# Patient Record
Sex: Male | Born: 1955 | Race: White | Hispanic: No | Marital: Married | Smoking: Never smoker
Health system: Southern US, Community
[De-identification: ages and names within clinical notes are randomized; demographics above are authoritative.]

## PROBLEM LIST (undated history)

## (undated) DIAGNOSIS — T7840XA Allergy, unspecified, initial encounter: Secondary | ICD-10-CM

## (undated) DIAGNOSIS — S83519A Sprain of anterior cruciate ligament of unspecified knee, initial encounter: Secondary | ICD-10-CM

## (undated) DIAGNOSIS — Z860100 Personal history of colon polyps, unspecified: Secondary | ICD-10-CM

## (undated) DIAGNOSIS — R7303 Prediabetes: Secondary | ICD-10-CM

## (undated) DIAGNOSIS — T1490XA Injury, unspecified, initial encounter: Secondary | ICD-10-CM

## (undated) DIAGNOSIS — E785 Hyperlipidemia, unspecified: Secondary | ICD-10-CM

## (undated) DIAGNOSIS — I10 Essential (primary) hypertension: Secondary | ICD-10-CM

## (undated) HISTORY — DX: Prediabetes: R73.03

## (undated) HISTORY — DX: Personal history of colonic polyps: Z86.010

## (undated) HISTORY — PX: UPPER GI ENDOSCOPY: SHX6162

## (undated) HISTORY — DX: Essential (primary) hypertension: I10

## (undated) HISTORY — DX: Personal history of colon polyps, unspecified: Z86.0100

## (undated) HISTORY — PX: HERNIA REPAIR: SHX51

## (undated) HISTORY — DX: Allergy, unspecified, initial encounter: T78.40XA

## (undated) HISTORY — DX: Sprain of anterior cruciate ligament of unspecified knee, initial encounter: S83.519A

---

## 1999-08-02 ENCOUNTER — Encounter: Payer: Self-pay | Admitting: Endocrinology

## 1999-08-02 ENCOUNTER — Encounter: Admission: RE | Admit: 1999-08-02 | Discharge: 1999-08-02 | Payer: Self-pay | Admitting: Endocrinology

## 1999-10-25 ENCOUNTER — Encounter: Payer: Self-pay | Admitting: Neurology

## 1999-10-25 ENCOUNTER — Encounter: Admission: RE | Admit: 1999-10-25 | Discharge: 1999-10-25 | Payer: Self-pay | Admitting: Neurology

## 2000-06-19 ENCOUNTER — Encounter: Payer: Self-pay | Admitting: Endocrinology

## 2000-06-19 ENCOUNTER — Encounter: Admission: RE | Admit: 2000-06-19 | Discharge: 2000-06-19 | Payer: Self-pay | Admitting: Endocrinology

## 2004-11-11 ENCOUNTER — Ambulatory Visit (HOSPITAL_COMMUNITY): Admission: RE | Admit: 2004-11-11 | Discharge: 2004-11-11 | Payer: Self-pay | Admitting: *Deleted

## 2008-05-29 DIAGNOSIS — S83519A Sprain of anterior cruciate ligament of unspecified knee, initial encounter: Secondary | ICD-10-CM

## 2008-05-29 HISTORY — DX: Sprain of anterior cruciate ligament of unspecified knee, initial encounter: S83.519A

## 2008-05-29 HISTORY — PX: ANTERIOR CRUCIATE LIGAMENT REPAIR: SHX115

## 2013-03-26 ENCOUNTER — Emergency Department (HOSPITAL_COMMUNITY): Payer: No Typology Code available for payment source

## 2013-03-26 ENCOUNTER — Emergency Department (HOSPITAL_COMMUNITY)
Admission: EM | Admit: 2013-03-26 | Discharge: 2013-03-27 | Disposition: A | Discharge status: Home / Self Care | Payer: No Typology Code available for payment source | Attending: Emergency Medicine | Admitting: Emergency Medicine

## 2013-03-26 ENCOUNTER — Encounter (HOSPITAL_COMMUNITY): Payer: Self-pay | Admitting: Emergency Medicine

## 2013-03-26 DIAGNOSIS — R51 Headache: Secondary | ICD-10-CM | POA: Insufficient documentation

## 2013-03-26 DIAGNOSIS — Z7982 Long term (current) use of aspirin: Secondary | ICD-10-CM | POA: Insufficient documentation

## 2013-03-26 DIAGNOSIS — S20219A Contusion of unspecified front wall of thorax, initial encounter: Secondary | ICD-10-CM

## 2013-03-26 DIAGNOSIS — Y92410 Unspecified street and highway as the place of occurrence of the external cause: Secondary | ICD-10-CM | POA: Insufficient documentation

## 2013-03-26 DIAGNOSIS — E785 Hyperlipidemia, unspecified: Secondary | ICD-10-CM | POA: Insufficient documentation

## 2013-03-26 DIAGNOSIS — Y9389 Activity, other specified: Secondary | ICD-10-CM | POA: Insufficient documentation

## 2013-03-26 DIAGNOSIS — Z79899 Other long term (current) drug therapy: Secondary | ICD-10-CM | POA: Insufficient documentation

## 2013-03-26 HISTORY — DX: Hyperlipidemia, unspecified: E78.5
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
## 2013-03-26 NOTE — ED Notes (Signed)
Pt to CT at this time.

## 2013-03-27 ENCOUNTER — Encounter (HOSPITAL_COMMUNITY): Payer: Self-pay | Admitting: Radiology

## 2013-03-27 ENCOUNTER — Emergency Department (HOSPITAL_COMMUNITY): Payer: No Typology Code available for payment source

## 2013-03-27 MED ORDER — OXYCODONE-ACETAMINOPHEN 5-325 MG PO TABS: 1.0000 | ORAL_TABLET | ORAL | Status: DC | PRN
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
## 2013-03-27 NOTE — ED Notes (Signed)
C-collar remains on pt.  Pt sitting up on stretcher.  Wife at bedside.  No complaints voiced at this time.

## 2014-08-13 ENCOUNTER — Other Ambulatory Visit: Payer: Self-pay | Admitting: General Surgery

## 2014-08-13 NOTE — Progress Notes (Signed)
Please put orders in Epic surgery 08-25-14 pre op 08-18-14 Thanks

## 2014-08-18 ENCOUNTER — Encounter (HOSPITAL_COMMUNITY): Payer: Self-pay

## 2014-08-18 ENCOUNTER — Encounter (HOSPITAL_COMMUNITY)
Admission: RE | Admit: 2014-08-18 | Discharge: 2014-08-18 | Disposition: A | Discharge status: Home / Self Care | Payer: 59 | Source: Ambulatory Visit | Attending: General Surgery | Admitting: General Surgery

## 2014-08-18 DIAGNOSIS — Z01812 Encounter for preprocedural laboratory examination: Secondary | ICD-10-CM | POA: Diagnosis not present

## 2014-08-18 HISTORY — DX: Injury, unspecified, initial encounter: T14.90XA

## 2014-08-18 LAB — BASIC METABOLIC PANEL
Anion gap: 8 (ref 5–15)
BUN: 14 mg/dL (ref 6–23)
CO2: 28 mmol/L (ref 19–32)
CREATININE: 1.2 mg/dL (ref 0.50–1.35)
Calcium: 10 mg/dL (ref 8.4–10.5)
Chloride: 104 mmol/L (ref 96–112)
GFR calc Af Amer: 76 mL/min — ABNORMAL LOW (ref >90)
GFR calc non Af Amer: 65 mL/min — ABNORMAL LOW (ref >90)
Glucose, Bld: 99 mg/dL (ref 70–99)
Potassium: 5.6 mmol/L — ABNORMAL HIGH (ref 3.5–5.1)
SODIUM: 140 mmol/L (ref 135–145)

## 2014-08-18 LAB — CBC
HCT: 44 % (ref 39.0–52.0)
HEMOGLOBIN: 14.5 g/dL (ref 13.0–17.0)
MCH: 29 pg (ref 26.0–34.0)
MCHC: 33 g/dL (ref 30.0–36.0)
MCV: 88 fL (ref 78.0–100.0)
Platelets: 180 10*3/uL (ref 150–400)
RBC: 5 MIL/uL (ref 4.22–5.81)
RDW: 13.1 % (ref 11.5–15.5)
WBC: 4.1 10*3/uL (ref 4.0–10.5)

## 2014-08-18 NOTE — Patient Instructions (Signed)
20 Mindi Curlingllen R Twilley  08/18/2014   Your procedure is scheduled on:       Tuesday August 25, 2014   [facility] and follow signs to  Short Stay Center arrive at 0530 AM.   Call this number if you have problems the morning of surgery [phone] or Presurgical Testing [phone].   Remember:  Do not eat food or drink liquids :After Midnight.      Take these medicines the morning of surgery with A SIP OF WATER: NONE                                You may not have any metal on your body including hair pins and piercings  Do not wear jewelry, colognes, lotions, powders, or deodorant.  Men may shave face and neck.               Do not bring valuables to the hospital. [facility] IS NOT RESPONSIBLE FOR VALUABLES.  Contacts, dentures or bridgework may not be worn into surgery.   Patients discharged the day of surgery will not be allowed to drive home.  Name and phone number of your driver:Sandy Stroder (wife) ________________________________________________________________________  [facility] - Preparing for Surgery Before surgery, you can play an important role.  Because skin is not sterile, your skin needs to be as free of germs as possible.  You can reduce the number of germs on your skin by washing with CHG (chlorahexidine gluconate) soap before surgery.  CHG is an antiseptic cleaner which kills germs and bonds with the skin to continue killing germs even after washing. Please DO NOT use if you have an allergy to CHG or antibacterial soaps.  If your skin becomes reddened/irritated stop using the CHG and inform your nurse when you arrive at Short Stay. Do not shave (including legs and underarms) for at least 48 hours prior to the first CHG shower.  You may shave your face/neck. Please follow these instructions carefully:  1.  Shower with CHG Soap the night before surgery and the  morning of Surgery.  2.  If you choose to wash your hair, wash your hair first as  usual with your  normal  shampoo.  3.  After you shampoo, rinse your hair and body thoroughly to remove the  shampoo.                           4.  Use CHG as you would any other liquid soap.  You can apply chg directly  to the skin and wash                       Gently with a scrungie or clean washcloth.  5.  Apply the CHG Soap to your body ONLY FROM THE NECK DOWN.   Do not use on face/ open                           Wound or open sores. Avoid contact with eyes, ears mouth and genitals (private parts).                       Wash face,  Genitals (private parts) with your normal soap.             6.  Wash thoroughly, paying special attention to the area where your surgery  will be performed.  7.  Thoroughly rinse your body with warm water from the neck down.  8.  DO NOT shower/wash with your normal soap after using and rinsing off  the CHG Soap.                9.  Pat yourself dry with a clean towel.            10.  Wear clean pajamas.            11.  Place clean sheets on your bed the night of your first shower and do not  sleep with pets. Day of Surgery : Do not apply any lotions/deodorants the morning of surgery.  Please wear clean clothes to the hospital/surgery center.  FAILURE TO FOLLOW THESE INSTRUCTIONS MAY RESULT IN THE CANCELLATION OF YOUR SURGERY PATIENT SIGNATURE_________________________________  NURSE SIGNATURE__________________________________  ________________________________________________________________________

## 2014-08-18 NOTE — Progress Notes (Signed)
BMP results per epic per PAT visit 08/18/2014 sent to Dr Johna SheriffHoxworth

## 2014-08-25 ENCOUNTER — Ambulatory Visit (HOSPITAL_COMMUNITY): Payer: 59 | Admitting: Anesthesiology

## 2014-08-25 ENCOUNTER — Ambulatory Visit (HOSPITAL_COMMUNITY)
Admission: RE | Admit: 2014-08-25 | Discharge: 2014-08-25 | Disposition: A | Discharge status: Home / Self Care | Payer: 59 | Source: Ambulatory Visit | Attending: General Surgery | Admitting: General Surgery

## 2014-08-25 ENCOUNTER — Encounter (HOSPITAL_COMMUNITY): Payer: Self-pay | Admitting: *Deleted

## 2014-08-25 ENCOUNTER — Encounter (HOSPITAL_COMMUNITY)
Admission: RE | Disposition: A | Discharge status: Home / Self Care | Payer: Self-pay | Source: Ambulatory Visit | Attending: General Surgery

## 2014-08-25 DIAGNOSIS — K4090 Unilateral inguinal hernia, without obstruction or gangrene, not specified as recurrent: Secondary | ICD-10-CM | POA: Diagnosis present

## 2014-08-25 DIAGNOSIS — Z7982 Long term (current) use of aspirin: Secondary | ICD-10-CM | POA: Diagnosis not present

## 2014-08-25 DIAGNOSIS — Z6830 Body mass index (BMI) 30.0-30.9, adult: Secondary | ICD-10-CM | POA: Diagnosis not present

## 2014-08-25 DIAGNOSIS — Z79891 Long term (current) use of opiate analgesic: Secondary | ICD-10-CM | POA: Diagnosis not present

## 2014-08-25 HISTORY — PX: INGUINAL HERNIA REPAIR: SHX194

## 2014-08-25 SURGERY — REPAIR, HERNIA, INGUINAL, LAPAROSCOPIC
Anesthesia: General | Laterality: Right

## 2014-08-25 MED ORDER — CHLORHEXIDINE GLUCONATE 4 % EX LIQD: 1.0000 "application " | Freq: Once | CUTANEOUS | Status: DC

## 2014-08-25 MED ORDER — MIDAZOLAM HCL 2 MG/2ML IJ SOLN
INTRAMUSCULAR | Status: AC
  Filled 2014-08-25: qty 2

## 2014-08-25 MED ORDER — PROMETHAZINE HCL 25 MG/ML IJ SOLN
6.2500 mg | INTRAMUSCULAR | Status: DC | PRN
  Administered 2014-08-25: 6.25 mg via INTRAVENOUS
  Filled 2014-08-25: qty 1

## 2014-08-25 MED ORDER — NEOSTIGMINE METHYLSULFATE 10 MG/10ML IV SOLN
INTRAVENOUS | Status: DC | PRN
  Administered 2014-08-25: 5 mg via INTRAVENOUS

## 2014-08-25 MED ORDER — ONDANSETRON HCL 4 MG/2ML IJ SOLN
INTRAMUSCULAR | Status: DC | PRN
  Administered 2014-08-25: 4 mg via INTRAVENOUS

## 2014-08-25 MED ORDER — LACTATED RINGERS IV SOLN
INTRAVENOUS | Status: DC | PRN
  Administered 2014-08-25 (×2): via INTRAVENOUS

## 2014-08-25 MED ORDER — FENTANYL CITRATE 0.05 MG/ML IJ SOLN
INTRAMUSCULAR | Status: AC
  Filled 2014-08-25: qty 5

## 2014-08-25 MED ORDER — OXYCODONE-ACETAMINOPHEN 5-325 MG PO TABS
1.0000 | ORAL_TABLET | ORAL | Status: DC | PRN
  Administered 2014-08-25: 1 via ORAL
  Filled 2014-08-25: qty 1

## 2014-08-25 MED ORDER — LIDOCAINE HCL (CARDIAC) 20 MG/ML IV SOLN
INTRAVENOUS | Status: DC | PRN
  Administered 2014-08-25: 100 mg via INTRAVENOUS

## 2014-08-25 MED ORDER — GLYCOPYRROLATE 0.2 MG/ML IJ SOLN
INTRAMUSCULAR | Status: AC
  Filled 2014-08-25: qty 3

## 2014-08-25 MED ORDER — BUPIVACAINE-EPINEPHRINE 0.25% -1:200000 IJ SOLN
INTRAMUSCULAR | Status: DC | PRN
  Administered 2014-08-25: 30 mL

## 2014-08-25 MED ORDER — ONDANSETRON HCL 4 MG/2ML IJ SOLN
INTRAMUSCULAR | Status: AC
  Filled 2014-08-25: qty 2

## 2014-08-25 MED ORDER — BUPIVACAINE-EPINEPHRINE (PF) 0.25% -1:200000 IJ SOLN
INTRAMUSCULAR | Status: AC
  Filled 2014-08-25: qty 30

## 2014-08-25 MED ORDER — PROPOFOL 10 MG/ML IV BOLUS
INTRAVENOUS | Status: AC
  Filled 2014-08-25: qty 20

## 2014-08-25 MED ORDER — CEFAZOLIN SODIUM-DEXTROSE 2-3 GM-% IV SOLR
2.0000 g | INTRAVENOUS | Status: AC
  Administered 2014-08-25: 2 g via INTRAVENOUS

## 2014-08-25 MED ORDER — PROPOFOL 10 MG/ML IV BOLUS
INTRAVENOUS | Status: DC | PRN
  Administered 2014-08-25: 200 mg via INTRAVENOUS

## 2014-08-25 MED ORDER — MEPERIDINE HCL 50 MG/ML IJ SOLN: 6.2500 mg | INTRAMUSCULAR | Status: DC | PRN

## 2014-08-25 MED ORDER — HYDROMORPHONE HCL 1 MG/ML IJ SOLN
0.2500 mg | INTRAMUSCULAR | Status: DC | PRN
  Administered 2014-08-25 (×2): 0.5 mg via INTRAVENOUS

## 2014-08-25 MED ORDER — ROCURONIUM BROMIDE 100 MG/10ML IV SOLN
INTRAVENOUS | Status: AC
  Filled 2014-08-25: qty 1

## 2014-08-25 MED ORDER — MIDAZOLAM HCL 2 MG/2ML IJ SOLN: 0.5000 mg | Freq: Once | INTRAMUSCULAR | Status: DC | PRN

## 2014-08-25 MED ORDER — GLYCOPYRROLATE 0.2 MG/ML IJ SOLN
INTRAMUSCULAR | Status: DC | PRN
  Administered 2014-08-25: .6 mg via INTRAVENOUS

## 2014-08-25 MED ORDER — MIDAZOLAM HCL 5 MG/5ML IJ SOLN
INTRAMUSCULAR | Status: DC | PRN
  Administered 2014-08-25: 2 mg via INTRAVENOUS

## 2014-08-25 MED ORDER — FENTANYL CITRATE 0.05 MG/ML IJ SOLN
INTRAMUSCULAR | Status: DC | PRN
  Administered 2014-08-25 (×5): 50 ug via INTRAVENOUS

## 2014-08-25 MED ORDER — CEFAZOLIN SODIUM-DEXTROSE 2-3 GM-% IV SOLR
INTRAVENOUS | Status: AC
  Filled 2014-08-25: qty 50

## 2014-08-25 MED ORDER — NEOSTIGMINE METHYLSULFATE 10 MG/10ML IV SOLN
INTRAVENOUS | Status: AC
  Filled 2014-08-25: qty 1

## 2014-08-25 MED ORDER — ROCURONIUM BROMIDE 100 MG/10ML IV SOLN
INTRAVENOUS | Status: DC | PRN
  Administered 2014-08-25: 45 mg via INTRAVENOUS
  Administered 2014-08-25: 20 mg via INTRAVENOUS
  Administered 2014-08-25: 5 mg via INTRAVENOUS

## 2014-08-25 MED ORDER — 0.9 % SODIUM CHLORIDE (POUR BTL) OPTIME
TOPICAL | Status: DC | PRN
  Administered 2014-08-25: 1000 mL

## 2014-08-25 MED ORDER — LIDOCAINE HCL (CARDIAC) 20 MG/ML IV SOLN
INTRAVENOUS | Status: AC
  Filled 2014-08-25: qty 5

## 2014-08-25 MED ORDER — HYDROMORPHONE HCL 1 MG/ML IJ SOLN
INTRAMUSCULAR | Status: AC
  Filled 2014-08-25: qty 1

## 2014-08-25 MED ORDER — OXYCODONE-ACETAMINOPHEN 5-325 MG PO TABS: 1.0000 | ORAL_TABLET | ORAL | Status: DC | PRN

## 2014-08-25 SURGICAL SUPPLY — 38 items
APPLIER CLIP 5 13 M/L LIGAMAX5 (MISCELLANEOUS)
BENZOIN TINCTURE PRP APPL 2/3 (GAUZE/BANDAGES/DRESSINGS) ×2 IMPLANT
CLIP APPLIE 5 13 M/L LIGAMAX5 (MISCELLANEOUS) IMPLANT
DECANTER SPIKE VIAL GLASS SM (MISCELLANEOUS) IMPLANT
DEVICE SECURE STRAP 25 ABSORB (INSTRUMENTS) ×2 IMPLANT
DISSECT BALLN SPACEMKR + OVL (BALLOONS) ×2
DISSECTOR BALLN SPACEMKR + OVL (BALLOONS) ×1 IMPLANT
DISSECTOR BLUNT TIP ENDO 5MM (MISCELLANEOUS) IMPLANT
DRAPE INCISE IOBAN 66X45 STRL (DRAPES) ×2 IMPLANT
DRAPE LAPAROSCOPIC ABDOMINAL (DRAPES) ×2 IMPLANT
ELECT REM PT RETURN 9FT ADLT (ELECTROSURGICAL) ×2
ELECTRODE REM PT RTRN 9FT ADLT (ELECTROSURGICAL) ×1 IMPLANT
GLOVE BIOGEL PI IND STRL 7.0 (GLOVE) ×1 IMPLANT
GLOVE BIOGEL PI INDICATOR 7.0 (GLOVE) ×1
GOWN STRL REUS W/TWL LRG LVL3 (GOWN DISPOSABLE) ×2 IMPLANT
GOWN STRL REUS W/TWL XL LVL3 (GOWN DISPOSABLE) ×4 IMPLANT
KIT BASIN OR (CUSTOM PROCEDURE TRAY) ×2 IMPLANT
LIQUID BAND (GAUZE/BANDAGES/DRESSINGS) IMPLANT
MARKER SKIN DUAL TIP RULER LAB (MISCELLANEOUS) ×2 IMPLANT
MESH 3DMAX 5X7 RT XLRG (Mesh General) ×2 IMPLANT
NEEDLE INSUFFLATION 14GA 120MM (NEEDLE) IMPLANT
SCISSORS LAP 5X35 DISP (ENDOMECHANICALS) ×2 IMPLANT
SET IRRIG TUBING LAPAROSCOPIC (IRRIGATION / IRRIGATOR) IMPLANT
SOLUTION ANTI FOG 6CC (MISCELLANEOUS) ×2 IMPLANT
STRIP CLOSURE SKIN 1/2X4 (GAUZE/BANDAGES/DRESSINGS) ×2 IMPLANT
SUT MNCRL AB 4-0 PS2 18 (SUTURE) ×4 IMPLANT
SUT PROLENE 2 0 CT2 30 (SUTURE) IMPLANT
SUT SILK 0 (SUTURE) ×1
SUT SILK 0 30XBRD TIE 6 (SUTURE) ×1 IMPLANT
SUT VIC AB 3-0 SH 27 (SUTURE)
SUT VIC AB 3-0 SH 27XBRD (SUTURE) IMPLANT
TACKER 5MM HERNIA 3.5CML NAB (ENDOMECHANICALS) ×2 IMPLANT
TOWEL OR 17X26 10 PK STRL BLUE (TOWEL DISPOSABLE) ×2 IMPLANT
TRAY FOLEY CATH 14FRSI W/METER (CATHETERS) IMPLANT
TRAY FOLEY METER SIL LF 16FR (CATHETERS) ×2 IMPLANT
TRAY LAPAROSCOPIC (CUSTOM PROCEDURE TRAY) ×2 IMPLANT
TROCAR CANNULA W/PORT DUAL 5MM (MISCELLANEOUS) ×4 IMPLANT
TUBING INSUFFLATION 10FT LAP (TUBING) ×2 IMPLANT

## 2014-08-25 NOTE — Anesthesia Preprocedure Evaluation (Addendum)
Anesthesia Evaluation  Patient identified by MRN, date of birth, ID band Patient awake    Reviewed: Allergy & Precautions, NPO status , Patient's Chart, lab work & pertinent test results, reviewed documented beta blocker date and time   Airway Mallampati: II  TM Distance: >3 FB Neck ROM: Full    Dental  (+) Teeth Intact, Dental Advisory Given   Pulmonary neg pulmonary ROS,  breath sounds clear to auscultation        Cardiovascular - anginanegative cardio ROS  Rhythm:Regular Rate:Normal     Neuro/Psych negative neurological ROS     GI/Hepatic negative GI ROS, Neg liver ROS,   Endo/Other  Morbid obesity  Renal/GU negative Renal ROS     Musculoskeletal   Abdominal (+) + obese,   Peds  Hematology negative hematology ROS (+)   Anesthesia Other Findings   Reproductive/Obstetrics                            Anesthesia Physical Anesthesia Plan  ASA: II  Anesthesia Plan: General   Post-op Pain Management:    Induction: Intravenous  Airway Management Planned: Oral ETT  Additional Equipment:   Intra-op Plan:   Post-operative Plan: Extubation in OR  Informed Consent: I have reviewed the patients History and Physical, chart, labs and discussed the procedure including the risks, benefits and alternatives for the proposed anesthesia with the patient or authorized representative who has indicated his/her understanding and acceptance.   Dental advisory given  Plan Discussed with: CRNA and Surgeon  Anesthesia Plan Comments: (Plan routine monitors, GETA)        Anesthesia Quick Evaluation

## 2014-08-25 NOTE — Interval H&P Note (Signed)
History and Physical Interval Note:  08/25/2014 7:32 AM  Phillip [name]  has presented today for surgery, with the diagnosis of right inguinal hernia  The various methods of treatment have been discussed with the patient and family. After consideration of risks, benefits and other options for treatment, the patient has consented to  Procedure(s): LAPAROSCOPIC RIGHT INGUINAL HERNIA REPAIR (Right) as a surgical intervention .  The patient's history has been reviewed, patient examined, no change in status, stable for surgery.  I have reviewed the patient's chart and labs.  Questions were answered to the patient's satisfaction.     [name] T

## 2014-08-25 NOTE — Discharge Instructions (Signed)
[facility] _______[facility]  UMBILICAL OR INGUINAL HERNIA REPAIR: POST OP INSTRUCTIONS  Always review your discharge instruction sheet given to you by the facility where your surgery was performed. IF YOU HAVE DISABILITY OR FAMILY LEAVE FORMS, YOU MUST BRING THEM TO THE OFFICE FOR PROCESSING.   DO NOT GIVE THEM TO YOUR DOCTOR.  1. A  prescription for pain medication may be given to you upon discharge.  Take your pain medication as prescribed, if needed.  If narcotic pain medicine is not needed, then you may take acetaminophen (Tylenol) or ibuprofen (Advil) as needed. 2. Take your usually prescribed medications unless otherwise directed. 3. If you need a refill on your pain medication, please contact your pharmacy.  They will contact our office to request authorization. Prescriptions will not be filled after 5 pm or on week-ends. 4. You should follow a light diet the first 24 hours after arrival home, such as soup and crackers, etc.  Be sure to include lots of fluids daily.  Resume your normal diet the day after surgery. 5. Most patients will experience some swelling and bruising around the umbilicus or in the groin and scrotum.  Ice packs and reclining will help.  Swelling and bruising can take several days to resolve.  6. It is common to experience some constipation if taking pain medication after surgery.  Increasing fluid intake and taking a stool softener (such as Colace) will usually help or prevent this problem from occurring.  A mild laxative (Milk of Magnesia or Miralax) should be taken according to package directions if there are no bowel movements after 48 hours. 7. Unless discharge instructions indicate otherwise, you may remove your bandages 24-48 hours after surgery, and you may shower at that time.  You may have steri-strips (small skin tapes) in place directly over the incision.  These strips should be left on the skin for 7-10 days.  If your surgeon used skin glue on the  incision, you may shower in 24 hours.  The glue will flake off over the next 2-3 weeks.  Any sutures or staples will be removed at the office during your follow-up visit. 8. ACTIVITIES:  You may resume regular (light) daily activities beginning the next day--such as daily self-care, walking, climbing stairs--gradually increasing activities as tolerated.  You may have sexual intercourse when it is comfortable.  Refrain from any heavy lifting or straining until approved by your doctor. a. You may drive when you are no longer taking prescription pain medication, you can comfortably wear a seatbelt, and you can safely maneuver your car and apply brakes. b. RETURN TO WORK:  __________________________________________________________ 9. You should see your doctor in the office for a follow-up appointment approximately 2-3 weeks after your surgery.  Make sure that you call for this appointment within a day or two after you arrive home to insure a convenient appointment time. 10. OTHER INSTRUCTIONS:  __________________________________________________________________________________________________________________________________________________________________________________________  WHEN TO CALL YOUR DOCTOR: 1. Fever over 101.0 2. Inability to urinate 3. Nausea and/or vomiting 4. Extreme swelling or bruising 5. Continued bleeding from incision. 6. Increased pain, redness, or drainage from the incision  The clinic staff is available to answer your questions during regular business hours.  Please don't hesitate to call and ask to speak to one of the nurses for clinical concerns.  If you have a medical emergency, go to the nearest emergency room or call 911.  A surgeon from [facility] is always on call at the hospital     [address], [address], [place], [place]  [address] ?  [address] [address], [place], [place]   [address] [phone] ? [phone] ? FAX [phone] Web site:  [email or web address]  

## 2014-08-25 NOTE — Progress Notes (Signed)
Report to Sharyn CreamerJudy Cooper RN

## 2014-08-25 NOTE — H&P (Signed)
History of Present Illness Phillip [name](Phillip [name] Nigg MD; 07/23/2014 9:36 AM) Patient words: hernia.  The patient is a 59 year old male who presents with an inguinal hernia. 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 He states that about 3 months ago he began to develop an intermittent burning discomfort in his right groin. He had been doing some heavy lifting several days previously. His symptoms have persisted over the last several months. He notices some swelling in the right groin that is larger at times than others. The discomfort is worse with physical activity and coughing and sneezing. He has no GI or urinary symptoms. No previous history of hernia repairs.   Other Problems Phillip [name](Sonya Bynum, CMA; 07/23/2014 8:58 AM) Hypercholesterolemia  Past Surgical History Phillip [name](Sonya Bynum, CMA; 07/23/2014 8:58 AM) Knee Surgery Left.  Diagnostic Studies History Phillip [name](Sonya Bynum, CMA; 07/23/2014 8:58 AM) Colonoscopy 5-10 years ago  Allergies Phillip [name](Sonya Bynum, CMA; 07/23/2014 8:59 AM) No Known Drug Allergies02/25/2016  Medication History (Sonya Bynum, CMA; 07/23/2014 9:00 AM) Aspirin EC (81MG  Tablet DR, Oral) Active. Percocet (5-325MG  Tablet, Oral as needed) Active. Medications Reconciled  Social History Phillip [name](Sonya Bynum, CMA; 07/23/2014 8:58 AM) Alcohol use Moderate alcohol use. Caffeine use Coffee. No drug use Tobacco use Never smoker.  Family History Phillip [name](Sonya Bynum, CMA; 07/23/2014 8:58 AM) Diabetes Mellitus Mother. Heart Disease Mother. Hypertension Mother.  Review of Systems Phillip [name](Sonya Bynum CMA; 07/23/2014 8:58 AM) General Not Present- Appetite Loss, Chills, Fatigue, Fever, Night Sweats, Weight Gain and Weight Loss. Skin Not Present- Change in Wart/Mole, Dryness, Hives, Jaundice, New Lesions, Non-Healing Wounds, Rash and Ulcer. HEENT Not Present- Earache, Hearing Loss, Hoarseness, Nose Bleed, Oral Ulcers, Ringing in the Ears, Seasonal Allergies, Sinus Pain, Sore  Throat, Visual Disturbances, Wears glasses/contact lenses and Yellow Eyes. Respiratory Not Present- Bloody sputum, Chronic Cough, Difficulty Breathing, Snoring and Wheezing. Breast Not Present- Breast Mass, Breast Pain, Nipple Discharge and Skin Changes. Cardiovascular Not Present- Chest Pain, Difficulty Breathing Lying Down, Leg Cramps, Palpitations, Rapid Heart Rate, Shortness of Breath and Swelling of Extremities. Gastrointestinal Present- Abdominal Pain. Not Present- Bloating, Bloody Stool, Change in Bowel Habits, Chronic diarrhea, Constipation, Difficulty Swallowing, Excessive gas, Gets full quickly at meals, Hemorrhoids, Indigestion, Nausea, Rectal Pain and Vomiting. Male Genitourinary Not Present- Blood in Urine, Change in Urinary Stream, Frequency, Impotence, Nocturia, Painful Urination, Urgency and Urine Leakage.   Vitals (Sonya Bynum CMA; 07/23/2014 8:59 AM) 07/23/2014 8:58 AM Weight: 237 lb Height: 74in Body Surface Area: 2.37 m Body Mass Index: 30.43 kg/m Temp.: 47F(Temporal)  Pulse: 75 (Regular)  BP: 134/82 (Sitting, Left Arm, Standard)    Physical Exam Phillip [name](Shinita Mac T. Amiir Heckard MD; 07/23/2014 9:37 AM) The physical exam findings are as follows: Note:General: Alert, well-developed and well nourished Caucasian male, in no distress Skin: Warm and dry without rash or infection. HEENT: No palpable masses or thyromegaly. Sclera nonicteric. Pupils equal round and reactive. Lymph nodes: No cervical, supraclavicular, or inguinal nodes palpable. Lungs: Breath sounds clear and equal. No wheezing or increased work of breathing. Cardiovascular: Regular rate and rhythm without murmer. No JVD or edema. Abdomen: Nondistended. Soft and nontender. No masses palpable. No organomegaly. There is a moderate sized slightly tender reducible right inguinal hernia that does not extend down toward the scrotum. No hernia palpable on the left. Testicles normal. Extremities: No edema or joint  swelling or deformity. No chronic venous stasis changes. Neurologic: Alert and fully oriented. Gait normal. No focal weakness. Psychiatric: Normal mood and affect. Thought content appropriate with normal judgement and  insight    Assessment & Plan Phillip [name] T. Shawntrice Salle MD; 07/23/2014 9:38 AM) HERNIA, INGUINAL, RIGHT (550.90  K40.90) Impression: Primary symptomatic right inguinal hernia. I discussed options for repair including laparoscopic and open repair. I believe he would be a good candidate for laparoscopic repair and this is what he would prefer. We discussed the surgery in detail including its nature and expected recovery. We discussed indications and risks including anesthetic complications, medication reactions, bleeding, infection, recurrence, visceral injury and chronic pain. He was given literature regarding the procedure and all his questions were answered. Current Plans  Schedule for Surgery Laparoscopic repair of right inguinal hernia under general anesthesia as an outpatient

## 2014-08-25 NOTE — Anesthesia Postprocedure Evaluation (Signed)
  Anesthesia Post-op Note  Patient: Phillip [name]  Procedure(s) Performed: Procedure(s): LAPAROSCOPIC RIGHT INGUINAL HERNIA REPAIR (Right)  Patient Location: PACU  Anesthesia Type:General  Level of Consciousness: awake, alert , oriented and patient cooperative  Airway and Oxygen Therapy: Patient Spontanous Breathing  Post-op Pain: none  Post-op Assessment: Post-op Vital signs reviewed, Patient's Cardiovascular Status Stable, Respiratory Function Stable, Patent Airway, No signs of Nausea or vomiting and Pain level controlled  Post-op Vital Signs: Reviewed and stable  Last Vitals:  Filed Vitals:   08/25/14 1111  BP: 125/81  Pulse: 68  Temp:   Resp: 16    Complications: No apparent anesthesia complications

## 2014-08-25 NOTE — Anesthesia Procedure Notes (Signed)
Procedure Name: Intubation Date/Time: 08/25/2014 7:44 AM Performed by: Donn PieriniUELLETTE, Kytzia Gienger G Pre-anesthesia Checklist: Patient identified, Emergency Drugs available, Suction available and Patient being monitored Patient Re-evaluated:Patient Re-evaluated prior to inductionOxygen Delivery Method: Circle system utilized Preoxygenation: Pre-oxygenation with 100% oxygen Intubation Type: IV induction Ventilation: Oral airway inserted - appropriate to patient size Laryngoscope Size: Hyacinth MeekerMiller and 2 Grade View: Grade I Tube type: Oral Tube size: 7.5 mm Number of attempts: 1 Placement Confirmation: ETT inserted through vocal cords under direct vision,  positive ETCO2,  CO2 detector and breath sounds checked- equal and bilateral Secured at: 23 cm Tube secured with: Tape Dental Injury: Teeth and Oropharynx as per pre-operative assessment

## 2014-08-25 NOTE — Transfer of Care (Signed)
Immediate Anesthesia Transfer of Care Note  Patient: Phillip [name]  Procedure(s) Performed: Procedure(s): LAPAROSCOPIC RIGHT INGUINAL HERNIA REPAIR (Right)  Patient Location: PACU  Anesthesia Type:General  Level of Consciousness: awake, alert  and oriented  Airway & Oxygen Therapy: Patient Spontanous Breathing and Patient connected to face mask oxygen  Post-op Assessment: Report given to RN and Post -op Vital signs reviewed and stable  Post vital signs: Reviewed and stable  Last Vitals:  Filed Vitals:   08/25/14 0535  BP: 139/99  Pulse: 76  Temp: 36.4 C  Resp: 18    Complications: No apparent anesthesia complications

## 2014-08-25 NOTE — Op Note (Signed)
Preoperative Diagnosis: right inguinal hernia  Postoprative Diagnosis: right inguinal hernia  Procedure: Procedure(s): LAPAROSCOPIC RIGHT INGUINAL HERNIA REPAIR   Surgeon: Glenna FellowsHoxworth, Jathniel Smeltzer T   Assistants: None  Anesthesia:  General endotracheal anesthesia  Indications: Patient is a 59 year old male who presents with a gradually enlarging symptomatic right inguinal hernia confirmed on exam. We have previously discussed options for repair and the nature of surgery and risks detailed elsewhere and have elected to proceed with laparoscopic repair of his right inguinal hernia.    Procedure Detail:  Patient was brought to the operating room, placed in the supine position on the operating table, and general endotracheal anesthesia induced. Foley catheter was placed. PAS weren't placed. He received preoperative IV antibiotics. The abdomen and groins were widely sterilely prepped and draped. Patient timeout was performed and correct procedure verified. A 1/2 cm incision was made just beneath the umbilicus and dissected carried down to the anterior fascia. This was incised transversely just to the right of midline and the medial edge of the right rectus muscle retracted laterally and the preperitoneal space entered under direct vision. The balloon dissector was passed with its tip into the space then advanced down along the midline to the pubis. The balloon was insufflated under laparoscopic vision with good bilateral dissection of the preperitoneal space. The balloon was left in place for several minutes for hemostasis then deflated and removed and the balloon trocar inflated and CO2 pressure applied. Under direct vision 25 mm trochars were placed in the left lower abdomen. The pubic symphysis was identified and pubic symphysis and Cooper's ligament cleared down to the iliac vessels which were carefully protected. The peritoneal edge was identified laterally and this was swept inferiorly working medial to  lateral until the peritoneum was completely taken down from the abdominal wall out to the level of the anterior superior iliac spine up to the level of the umbilicus. The peritoneal edge was then followed back medially. The epigastric vessels were identified and they had been dissected slightly posteriorly but were further dissected and retracted back anteriorly. The internal ring was dilated and there was a fairly large cord lipoma of retroperitoneal fat that was reduced and dissected posteriorly. There was a small peritoneal sac as well in the knee and was completely dissected off of the cord structures and reduced posteriorly. Medially epigastric vessels there was also a small direct hernia which was completely cleared and dissected. After a thorough dissection I chose a piece of extra large Bard 3-D max mesh which was introduced into the preperitoneal space and then unfurled and oriented. The medial corner of the mesh was initially tacked at the pubic symphysis. The mesh was then deployed well out laterally and superiorly. Beginning at the superior edge of the mesh laterally where I could feel the tacker through the anterior abdominal wall the superior edge of the mesh was tacked working back medially avoiding the epigastric vessels and then finally the medial edge of the mesh was secured and a couple more tacks were placed in Cooper's ligament. This appeared to provide very broad coverage of the direct and indirect spaces. The operative site was inspected for hemostasis and there was no bleeding or other problems identified. All CO2 was evacuated and trochars removed. The fascial defect at the umbilicus was closed with a figure-of-eight suture of 0 Vicryl. Skin incisions were closed with subcuticular Monocryl and Dermabond. Sponge and needle instrument counts were correct.    Findings: As above  Estimated Blood Loss:  Minimal  Drains: none  Blood Given: none          Specimens: None         Complications:  * No complications entered in OR log *         Disposition: PACU - hemodynamically stable.         Condition: stable

## 2014-08-25 NOTE — Progress Notes (Signed)
Pt up OOB ambulated x 4 laps in short stay department.  Pt tolerated well, attempted to void but unsuccessful.  Pt states he is feeling a little nauseated.  VS stable, see MAR for antiemetic treatment.

## 2014-08-27 ENCOUNTER — Encounter (HOSPITAL_COMMUNITY): Payer: Self-pay | Admitting: General Surgery
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
## 2015-05-30 HISTORY — PX: COLONOSCOPY: SHX174

## 2015-12-29 DIAGNOSIS — E789 Disorder of lipoprotein metabolism, unspecified: Secondary | ICD-10-CM | POA: Diagnosis not present

## 2015-12-29 DIAGNOSIS — Z0001 Encounter for general adult medical examination with abnormal findings: Secondary | ICD-10-CM | POA: Diagnosis not present

## 2015-12-29 DIAGNOSIS — Z125 Encounter for screening for malignant neoplasm of prostate: Secondary | ICD-10-CM | POA: Diagnosis not present

## 2016-01-05 DIAGNOSIS — E789 Disorder of lipoprotein metabolism, unspecified: Secondary | ICD-10-CM | POA: Diagnosis not present

## 2016-01-05 DIAGNOSIS — E6609 Other obesity due to excess calories: Secondary | ICD-10-CM | POA: Diagnosis not present

## 2016-01-05 DIAGNOSIS — T887XXA Unspecified adverse effect of drug or medicament, initial encounter: Secondary | ICD-10-CM | POA: Diagnosis not present

## 2016-01-13 DIAGNOSIS — Z1211 Encounter for screening for malignant neoplasm of colon: Secondary | ICD-10-CM | POA: Diagnosis not present

## 2016-01-13 DIAGNOSIS — K648 Other hemorrhoids: Secondary | ICD-10-CM | POA: Diagnosis not present

## 2016-01-13 DIAGNOSIS — Z01818 Encounter for other preprocedural examination: Secondary | ICD-10-CM | POA: Diagnosis not present

## 2016-02-09 DIAGNOSIS — D124 Benign neoplasm of descending colon: Secondary | ICD-10-CM | POA: Diagnosis not present

## 2016-02-09 DIAGNOSIS — K635 Polyp of colon: Secondary | ICD-10-CM | POA: Diagnosis not present

## 2016-02-09 DIAGNOSIS — Z1211 Encounter for screening for malignant neoplasm of colon: Secondary | ICD-10-CM | POA: Diagnosis not present

## 2016-02-09 DIAGNOSIS — K648 Other hemorrhoids: Secondary | ICD-10-CM | POA: Diagnosis not present

## 2016-02-09 DIAGNOSIS — D120 Benign neoplasm of cecum: Secondary | ICD-10-CM | POA: Diagnosis not present

## 2016-02-14 DIAGNOSIS — R21 Rash and other nonspecific skin eruption: Secondary | ICD-10-CM | POA: Diagnosis not present

## 2016-02-17 DIAGNOSIS — Z1211 Encounter for screening for malignant neoplasm of colon: Secondary | ICD-10-CM | POA: Diagnosis not present

## 2016-02-17 DIAGNOSIS — K635 Polyp of colon: Secondary | ICD-10-CM | POA: Diagnosis not present

## 2016-02-17 DIAGNOSIS — R21 Rash and other nonspecific skin eruption: Secondary | ICD-10-CM | POA: Diagnosis not present

## 2016-03-30 DIAGNOSIS — H00012 Hordeolum externum right lower eyelid: Secondary | ICD-10-CM | POA: Diagnosis not present

## 2016-07-04 DIAGNOSIS — E789 Disorder of lipoprotein metabolism, unspecified: Secondary | ICD-10-CM | POA: Diagnosis not present

## 2016-07-11 DIAGNOSIS — R899 Unspecified abnormal finding in specimens from other organs, systems and tissues: Secondary | ICD-10-CM | POA: Diagnosis not present

## 2016-07-11 DIAGNOSIS — D751 Secondary polycythemia: Secondary | ICD-10-CM | POA: Diagnosis not present

## 2016-07-11 DIAGNOSIS — E789 Disorder of lipoprotein metabolism, unspecified: Secondary | ICD-10-CM | POA: Diagnosis not present

## 2017-05-10 ENCOUNTER — Encounter: Payer: Self-pay | Admitting: *Deleted

## 2017-05-14 ENCOUNTER — Encounter: Payer: Self-pay | Admitting: Family Medicine

## 2017-06-01 DIAGNOSIS — Z23 Encounter for immunization: Secondary | ICD-10-CM | POA: Diagnosis not present

## 2017-06-04 ENCOUNTER — Ambulatory Visit: Payer: BLUE CROSS/BLUE SHIELD | Admitting: Family Medicine

## 2017-06-04 ENCOUNTER — Encounter: Payer: Self-pay | Admitting: Family Medicine

## 2017-06-04 VITALS — BP 162/106 | HR 74 | Temp 98.5°F | Resp 20 | Ht 74.0 in | Wt 238.5 lb

## 2017-06-04 DIAGNOSIS — Z7689 Persons encountering health services in other specified circumstances: Secondary | ICD-10-CM | POA: Diagnosis not present

## 2017-06-04 DIAGNOSIS — Z1159 Encounter for screening for other viral diseases: Secondary | ICD-10-CM

## 2017-06-04 DIAGNOSIS — Z125 Encounter for screening for malignant neoplasm of prostate: Secondary | ICD-10-CM

## 2017-06-04 DIAGNOSIS — Z114 Encounter for screening for human immunodeficiency virus [HIV]: Secondary | ICD-10-CM

## 2017-06-04 DIAGNOSIS — I10 Essential (primary) hypertension: Secondary | ICD-10-CM | POA: Insufficient documentation

## 2017-06-04 DIAGNOSIS — Z131 Encounter for screening for diabetes mellitus: Secondary | ICD-10-CM | POA: Diagnosis not present

## 2017-06-04 DIAGNOSIS — E785 Hyperlipidemia, unspecified: Secondary | ICD-10-CM | POA: Insufficient documentation

## 2017-06-04 DIAGNOSIS — Z0001 Encounter for general adult medical examination with abnormal findings: Secondary | ICD-10-CM | POA: Diagnosis not present

## 2017-06-04 DIAGNOSIS — R030 Elevated blood-pressure reading, without diagnosis of hypertension: Secondary | ICD-10-CM | POA: Insufficient documentation

## 2017-06-04 MED ORDER — LISINOPRIL 10 MG PO TABS: 10.0000 mg | ORAL_TABLET | Freq: Every day | ORAL | 0 refills | Status: DC

## 2017-06-04 MED ORDER — ZOSTER VAC RECOMB ADJUVANTED 50 MCG/0.5ML IM SUSR: 0.5000 mL | Freq: Once | INTRAMUSCULAR | 1 refills | Status: AC

## 2017-06-04 NOTE — Patient Instructions (Addendum)
Start lisinopril 10 mg a day.  Hydrate, low salt. Exercise as we discussed.  Follow up Friday morning with nurse visit for BP recheck and fasting labs.     Health Maintenance, Male A healthy lifestyle and preventive care is important for your health and wellness. Ask your health care provider about what schedule of regular examinations is right for you. What should I know about weight and diet? Eat a Healthy Diet  Eat plenty of vegetables, fruits, whole grains, low-fat dairy products, and lean protein.  Do not eat a lot of foods high in solid fats, added sugars, or salt.  Maintain a Healthy Weight Regular exercise can help you achieve or maintain a healthy weight. You should:  Do at least 150 minutes of exercise each week. The exercise should increase your heart rate and make you sweat (moderate-intensity exercise).  Do strength-training exercises at least twice a week.  Watch Your Levels of Cholesterol and Blood Lipids  Have your blood tested for lipids and cholesterol every 5 years starting at 61 years of age. If you are at high risk for heart disease, you should start having your blood tested when you are 61 years old. You may need to have your cholesterol levels checked more often if: ? Your lipid or cholesterol levels are high. ? You are older than 61 years of age. ? You are at high risk for heart disease.  What should I know about cancer screening? Many types of cancers can be detected early and may often be prevented. Lung Cancer  You should be screened every year for lung cancer if: ? You are a current smoker who has smoked for at least 30 years. ? You are a former smoker who has quit within the past 15 years.  Talk to your health care provider about your screening options, when you should start screening, and how often you should be screened.  Colorectal Cancer  Routine colorectal cancer screening usually begins at 61 years of age and should be repeated every 5-10  years until you are 61 years old. You may need to be screened more often if early forms of precancerous polyps or small growths are found. Your health care provider may recommend screening at an earlier age if you have risk factors for colon cancer.  Your health care provider may recommend using home test kits to check for hidden blood in the stool.  A small camera at the end of a tube can be used to examine your colon (sigmoidoscopy or colonoscopy). This checks for the earliest forms of colorectal cancer.  Prostate and Testicular Cancer  Depending on your age and overall health, your health care provider may do certain tests to screen for prostate and testicular cancer.  Talk to your health care provider about any symptoms or concerns you have about testicular or prostate cancer.  Skin Cancer  Check your skin from head to toe regularly.  Tell your health care provider about any new moles or changes in moles, especially if: ? There is a change in a mole's size, shape, or color. ? You have a mole that is larger than a pencil eraser.  Always use sunscreen. Apply sunscreen liberally and repeat throughout the day.  Protect yourself by wearing long sleeves, pants, a wide-brimmed hat, and sunglasses when outside.  What should I know about heart disease, diabetes, and high blood pressure?  If you are 64-32 years of age, have your blood pressure checked every 3-5 years. If you are  61 years of age or older, have your blood pressure checked every year. You should have your blood pressure measured twice-once when you are at a hospital or clinic, and once when you are not at a hospital or clinic. Record the average of the two measurements. To check your blood pressure when you are not at a hospital or clinic, you can use: ? An automated blood pressure machine at a pharmacy. ? A home blood pressure monitor.  Talk to your health care provider about your target blood pressure.  If you are between  4345-61 years old, ask your health care provider if you should take aspirin to prevent heart disease.  Have regular diabetes screenings by checking your fasting blood sugar level. ? If you are at a normal weight and have a low risk for diabetes, have this test once every three years after the age of 61. ? If you are overweight and have a high risk for diabetes, consider being tested at a younger age or more often.  A one-time screening for abdominal aortic aneurysm (AAA) by ultrasound is recommended for men aged 65-75 years who are current or former smokers. What should I know about preventing infection? Hepatitis B If you have a higher risk for hepatitis B, you should be screened for this virus. Talk with your health care provider to find out if you are at risk for hepatitis B infection. Hepatitis C Blood testing is recommended for:  Everyone born from [id number] through 1964.  Anyone with known risk factors for hepatitis C.  Sexually Transmitted Diseases (STDs)  You should be screened each year for STDs including gonorrhea and chlamydia if: ? You are sexually active and are younger than 61 years of age. ? You are older than 61 years of age and your health care provider tells you that you are at risk for this type of infection. ? Your sexual activity has changed since you were last screened and you are at an increased risk for chlamydia or gonorrhea. Ask your health care provider if you are at risk.  Talk with your health care provider about whether you are at high risk of being infected with HIV. Your health care provider may recommend a prescription medicine to help prevent HIV infection.  What else can I do?  Schedule regular health, dental, and eye exams.  Stay current with your vaccines (immunizations).  Do not use any tobacco products, such as cigarettes, chewing tobacco, and e-cigarettes. If you need help quitting, ask your health care provider.  Limit alcohol intake to no more than  2 drinks per day. One drink equals 12 ounces of beer, 5 ounces of wine, or 1 ounces of hard liquor.  Do not use street drugs.  Do not share needles.  Ask your health care provider for help if you need support or information about quitting drugs.  Tell your health care provider if you often feel depressed.  Tell your health care provider if you have ever been abused or do not feel safe at home. This information is not intended to replace advice given to you by your health care provider. Make sure you discuss any questions you have with your health care provider. Document Released: 11/11/2007 Document Revised: 01/12/2016 Document Reviewed: 02/16/2015 Elsevier Interactive Patient Education  2018 [employer]   Low-Sodium Eating Plan Sodium, which is an element that makes up salt, helps you maintain a healthy balance of fluids in your body. Too much sodium can increase your blood pressure  and cause fluid and waste to be held in your body. Your health care provider or dietitian may recommend following this plan if you have high blood pressure (hypertension), kidney disease, liver disease, or heart failure. Eating less sodium can help lower your blood pressure, reduce swelling, and protect your heart, liver, and kidneys. What are tips for following this plan? General guidelines  Most people on this plan should limit their sodium intake to 1,500-2,000 mg (milligrams) of sodium each day. Reading food labels  The Nutrition Facts label lists the amount of sodium in one serving of the food. If you eat more than one serving, you must multiply the listed amount of sodium by the number of servings.  Choose foods with less than 140 mg of sodium per serving.  Avoid foods with 300 mg of sodium or more per serving. Shopping  Look for lower-sodium products, often labeled as "low-sodium" or "no salt added."  Always check the sodium content even if foods are labeled as "unsalted" or "no salt  added".  Buy fresh foods. ? Avoid canned foods and premade or frozen meals. ? Avoid canned, cured, or processed meats  Buy breads that have less than 80 mg of sodium per slice. Cooking  Eat more home-cooked food and less restaurant, buffet, and fast food.  Avoid adding salt when cooking. Use salt-free seasonings or herbs instead of table salt or sea salt. Check with your health care provider or pharmacist before using salt substitutes.  Cook with plant-based oils, such as canola, sunflower, or olive oil. Meal planning  When eating at a restaurant, ask that your food be prepared with less salt or no salt, if possible.  Avoid foods that contain MSG (monosodium glutamate). MSG is sometimes added to [place] food, bouillon, and some canned foods. What foods are recommended? The items listed may not be a complete list. Talk with your dietitian about what dietary choices are best for you. Grains Low-sodium cereals, including oats, puffed wheat and rice, and shredded wheat. Low-sodium crackers. Unsalted rice. Unsalted pasta. Low-sodium bread. Whole-grain breads and whole-grain pasta. Vegetables Fresh or frozen vegetables. "No salt added" canned vegetables. "No salt added" tomato sauce and paste. Low-sodium or reduced-sodium tomato and vegetable juice. Fruits Fresh, frozen, or canned fruit. Fruit juice. Meats and other protein foods Fresh or frozen (no salt added) meat, poultry, seafood, and fish. Low-sodium canned tuna and salmon. Unsalted nuts. Dried peas, beans, and lentils without added salt. Unsalted canned beans. Eggs. Unsalted nut butters. Dairy Milk. Soy milk. Cheese that is naturally low in sodium, such as ricotta cheese, fresh mozzarella, or Swiss cheese Low-sodium or reduced-sodium cheese. Cream cheese. Yogurt. Fats and oils Unsalted butter. Unsalted margarine with no trans fat. Vegetable oils such as canola or olive oils. Seasonings and other foods Fresh and dried herbs and  spices. Salt-free seasonings. Low-sodium mustard and ketchup. Sodium-free salad dressing. Sodium-free light mayonnaise. Fresh or refrigerated horseradish. Lemon juice. Vinegar. Homemade, reduced-sodium, or low-sodium soups. Unsalted popcorn and pretzels. Low-salt or salt-free chips. What foods are not recommended? The items listed may not be a complete list. Talk with your dietitian about what dietary choices are best for you. Grains Instant hot cereals. Bread stuffing, pancake, and biscuit mixes. Croutons. Seasoned rice or pasta mixes. Noodle soup cups. Boxed or frozen macaroni and cheese. Regular salted crackers. Self-rising flour. Vegetables Sauerkraut, pickled vegetables, and relishes. Olives. [place] fries. Onion rings. Regular canned vegetables (not low-sodium or reduced-sodium). Regular canned tomato sauce and paste (not low-sodium or reduced-sodium).  Regular tomato and vegetable juice (not low-sodium or reduced-sodium). Frozen vegetables in sauces. Meats and other protein foods Meat or fish that is salted, canned, smoked, spiced, or pickled. Bacon, ham, sausage, hotdogs, corned beef, chipped beef, packaged lunch meats, salt pork, jerky, pickled herring, anchovies, regular canned tuna, sardines, salted nuts. Dairy Processed cheese and cheese spreads. Cheese curds. Blue cheese. Feta cheese. String cheese. Regular cottage cheese. Buttermilk. Canned milk. Fats and oils Salted butter. Regular margarine. Ghee. Bacon fat. Seasonings and other foods Onion salt, garlic salt, seasoned salt, table salt, and sea salt. Canned and packaged gravies. Worcestershire sauce. Tartar sauce. Barbecue sauce. Teriyaki sauce. Soy sauce, including reduced-sodium. Steak sauce. Fish sauce. Oyster sauce. Cocktail sauce. Horseradish that you find on the shelf. Regular ketchup and mustard. Meat flavorings and tenderizers. Bouillon cubes. Hot sauce and Tabasco sauce. Premade or packaged marinades. Premade or packaged taco  seasonings. Relishes. Regular salad dressings. Salsa. Potato and tortilla chips. Corn chips and puffs. Salted popcorn and pretzels. Canned or dried soups. Pizza. Frozen entrees and pot pies. Summary  Eating less sodium can help lower your blood pressure, reduce swelling, and protect your heart, liver, and kidneys.  Most people on this plan should limit their sodium intake to 1,500-2,000 mg (milligrams) of sodium each day.  Canned, boxed, and frozen foods are high in sodium. Restaurant foods, fast foods, and pizza are also very high in sodium. You also get sodium by adding salt to food.  Try to cook at home, eat more fresh fruits and vegetables, and eat less fast food, canned, processed, or prepared foods. This information is not intended to replace advice given to you by your health care provider. Make sure you discuss any questions you have with your health care provider. Document Released: 11/04/2001 Document Revised: 05/08/2016 Document Reviewed: 05/08/2016 Elsevier Interactive Patient Education  [employer].  Please help [place] help you:  We are honored you have chosen [facility] for your Primary Care home. Below you will find basic instructions that you may need to access in the future. Please help [place] help you by reading the instructions, which cover many of the frequent questions we experience.   Prescription refills and request:  -In order to allow more efficient response time, please call your pharmacy for all refills. They will forward the request electronically to [place]. This allows for the quickest possible response. Request left on a nurse line can take longer to refill, since these are checked as time allows between office patients and other phone calls.  - refill request can take up to 3-5 working days to complete.  - If request is sent electronically and request is appropiate, it is usually completed in 1-2 business days.  - all patients will need to be seen routinely for  all chronic medical conditions requiring prescription medications (see follow-up below). If you are overdue for follow up on your condition, you will be asked to make an appointment and we will call in enough medication to cover you until your appointment (up to 30 days).  - all controlled substances will require a face to face visit to request/refill.  - if you desire your prescriptions to go through a new pharmacy, and have an active script at original pharmacy, you will need to call your pharmacy and have scripts transferred to new pharmacy. This is completed between the pharmacy locations and not by your provider.    Results: If any images or labs were ordered, it can take up to  1 week to get results depending on the test ordered and the lab/facility running and resulting the test. - Normal or stable results, which do not need further discussion, may be released to your mychart immediately with attached note to you. A call may not be generated for normal results. Please make certain to sign up for mychart. If you have questions on how to activate your mychart you can call the front office.  - If your results need further discussion, our office will attempt to contact you via phone, and if unable to reach you after 2 attempts, we will release your abnormal result to your mychart with instructions.  - All results will be automatically released in mychart after 1 week.  - Your provider will provide you with explanation and instruction on all relevant material in your results. Please keep in mind, results and labs may appear confusing or abnormal to the untrained eye, but it does not mean they are actually abnormal for you personally. If you have any questions about your results that are not covered, or you desire more detailed explanation than what was provided, you should make an appointment with your provider to do so.   Our office handles many outgoing and incoming calls daily. If we have not  contacted you within 1 week about your results, please check your mychart to see if there is a message first and if not, then contact our office.  In helping with this matter, you help decrease call volume, and therefore allow [place] to be able to respond to patients needs more efficiently.   Acute office visits (sick visit):  An acute visit is intended for a new problem and are scheduled in shorter time slots to allow schedule openings for patients with new problems. This is the appropriate visit to discuss a new problem. In order to provide you with excellent quality medical care with proper time for you to explain your problem, have an exam and receive treatment with instructions, these appointments should be limited to one new problem per visit. If you experience a new problem, in which you desire to be addressed, please make an acute office visit, we save openings on the schedule to accommodate you. Please do not save your new problem for any other type of visit, let us take care of it properly and quickly for you.   Follow up visits:  Depending on your condition(s) your provider will need to see you routinely in order to provide you with quality care and prescribe medication(s). Most chronic conditions (Example: hypertension, Diabetes, depression/anxiety... etc), require visits a couple times a year. Your provider will instruct you on proper follow up for your personal medical conditions and history. Please make certain to make follow up appointments for your condition as instructed. Failing to do so could result in lapse in your medication treatment/refills. If you request a refill, and are overdue to be seen on a condition, we will always provide you with a 30 day script (once) to allow you time to schedule.    Medicare wellness (well visit): - we have a wonderful Nurse Selena Batten), that will meet with you and provide you will yearly medicare wellness visits. These visits should occur yearly (can not be  scheduled less than 1 calendar year apart) and cover preventive health, immunizations, advance directives and screenings you are entitled to yearly through your medicare benefits. Do not miss out on your entitled benefits, this is when medicare will pay for these benefits to be  ordered for you.  These are strongly encouraged by your provider and is the appropriate type of visit to make certain you are up to date with all preventive health benefits. If you have not had your medicare wellness exam in the last 12 months, please make certain to schedule one by calling the office and schedule your medicare wellness with Selena Batten as soon as possible.   Yearly physical (well visit):  - Adults are recommended to be seen yearly for physicals. Check with your insurance and date of your last physical, most insurances require one calendar year between physicals. Physicals include all preventive health topics, screenings, medical exam and labs that are appropriate for gender/age and history. You may have fasting labs needed at this visit. This is a well visit (not a sick visit), new problems should not be covered during this visit (see acute visit).  - Pediatric patients are seen more frequently when they are younger. Your provider will advise you on well child visit timing that is appropriate for your their age. - This is not a medicare wellness visit. Medicare wellness exams do not have an exam portion to the visit. Some medicare companies allow for a physical, some do not allow a yearly physical. If your medicare allows a yearly physical you can schedule the medicare wellness with our nurse Selena Batten and have your physical with your provider after, on the same day. Please check with insurance for your full benefits.   Late Policy/No Shows:  - all new patients should arrive 15-30 minutes earlier than appointment to allow [place] time  to  obtain all personal demographics,  insurance information and for you to complete office  paperwork. - All established patients should arrive 10-15 minutes earlier than appointment time to update all information and be checked in .  - In our best efforts to run on time, if you are late for your appointment you will be asked to either reschedule or if able, we will work you back into the schedule. There will be a wait time to work you back in the schedule,  depending on availability.  - If you are unable to make it to your appointment as scheduled, please call 24 hours ahead of time to allow [place] to fill the time slot with someone else who needs to be seen. If you do not cancel your appointment ahead of time, you may be charged a no show fee.
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
## 2017-06-05 ENCOUNTER — Ambulatory Visit: Payer: Self-pay

## 2017-06-05 NOTE — Telephone Encounter (Signed)
Pt calling with sx of nausea, difficulty focusing, gas. No vomiting or fever. Able to tolerate and keep fluids down. Pt states symptoms began yesterday after he started his Lisinopril. Pt requesting to know what to do about continuing or discontinuing Lisinopril. Pt requesting advice from PCP office regarding Lisinopril. Advised pt to keep drinking clear liquids and to stay away form GYM in case pt is having virus vs. Med reaction.   Reason for Disposition . Caller has NON-URGENT medication question about med that PCP prescribed and triager unable to answer question . Caller has medication question, adult has minor symptoms, caller declines triage, and triager answers question  Answer Assessment - Initial Assessment Questions 1. SYMPTOMS: "Do you have any symptoms?"     Stomach "rumbly", 1/2 hour taking med after feels like "heartburn", gas, nausea. No vomiting. "Difficulty focusing" no other sx 2. SEVERITY: If symptoms are present, ask "Are they mild, moderate or severe?"     mild  Protocols used: MEDICATION QUESTION CALL-A-AH

## 2017-06-05 NOTE — Telephone Encounter (Signed)
Spoke with patient reviewed  instructions. Patient verbalized understanding. 

## 2017-06-05 NOTE — Telephone Encounter (Signed)
Pt can hold lisinopril until tolerating PO (no nausea). Unlikely his symptoms are from medication. If symptoms return after restarting then stop med and be seen immediately.

## 2017-06-08 ENCOUNTER — Ambulatory Visit (INDEPENDENT_AMBULATORY_CARE_PROVIDER_SITE_OTHER): Payer: BLUE CROSS/BLUE SHIELD

## 2017-06-08 ENCOUNTER — Telehealth: Payer: Self-pay | Admitting: *Deleted

## 2017-06-08 ENCOUNTER — Other Ambulatory Visit (INDEPENDENT_AMBULATORY_CARE_PROVIDER_SITE_OTHER): Payer: BLUE CROSS/BLUE SHIELD

## 2017-06-08 VITALS — BP 144/78 | HR 80

## 2017-06-08 DIAGNOSIS — E785 Hyperlipidemia, unspecified: Secondary | ICD-10-CM | POA: Diagnosis not present

## 2017-06-08 DIAGNOSIS — I10 Essential (primary) hypertension: Secondary | ICD-10-CM | POA: Diagnosis not present

## 2017-06-08 DIAGNOSIS — Z114 Encounter for screening for human immunodeficiency virus [HIV]: Secondary | ICD-10-CM | POA: Diagnosis not present

## 2017-06-08 DIAGNOSIS — Z125 Encounter for screening for malignant neoplasm of prostate: Secondary | ICD-10-CM | POA: Diagnosis not present

## 2017-06-08 DIAGNOSIS — Z1159 Encounter for screening for other viral diseases: Secondary | ICD-10-CM

## 2017-06-08 DIAGNOSIS — Z131 Encounter for screening for diabetes mellitus: Secondary | ICD-10-CM

## 2017-06-08 LAB — CBC WITH DIFFERENTIAL/PLATELET
BASOS ABS: 0.1 10*3/uL (ref 0.0–0.1)
Basophils Relative: 1.5 % (ref 0.0–3.0)
EOS PCT: 2.9 % (ref 0.0–5.0)
Eosinophils Absolute: 0.1 10*3/uL (ref 0.0–0.7)
HCT: 42.9 % (ref 39.0–52.0)
HEMOGLOBIN: 14 g/dL (ref 13.0–17.0)
Lymphocytes Relative: 45.3 % (ref 12.0–46.0)
Lymphs Abs: 1.8 10*3/uL (ref 0.7–4.0)
MCHC: 32.7 g/dL (ref 30.0–36.0)
MCV: 89.4 fl (ref 78.0–100.0)
MONO ABS: 0.3 10*3/uL (ref 0.1–1.0)
MONOS PCT: 8.3 % (ref 3.0–12.0)
Neutro Abs: 1.7 10*3/uL (ref 1.4–7.7)
Neutrophils Relative %: 42 % — ABNORMAL LOW (ref 43.0–77.0)
Platelets: 192 10*3/uL (ref 150.0–400.0)
RBC: 4.8 Mil/uL (ref 4.22–5.81)
RDW: 13.7 % (ref 11.5–15.5)
WBC: 4 10*3/uL (ref 4.0–10.5)

## 2017-06-08 LAB — TSH: TSH: 2.33 u[IU]/mL (ref 0.35–4.50)

## 2017-06-08 LAB — COMPREHENSIVE METABOLIC PANEL
ALBUMIN: 4.4 g/dL (ref 3.5–5.2)
ALK PHOS: 55 U/L (ref 39–117)
ALT: 17 U/L (ref 0–53)
AST: 15 U/L (ref 0–37)
BUN: 13 mg/dL (ref 6–23)
CO2: 30 mEq/L (ref 19–32)
Calcium: 9.6 mg/dL (ref 8.4–10.5)
Chloride: 102 mEq/L (ref 96–112)
Creatinine, Ser: 1.13 mg/dL (ref 0.40–1.50)
GFR: 70.21 mL/min (ref >60.00)
Glucose, Bld: 115 mg/dL — ABNORMAL HIGH (ref 70–99)
POTASSIUM: 5 meq/L (ref 3.5–5.1)
Sodium: 137 mEq/L (ref 135–145)
TOTAL PROTEIN: 6.7 g/dL (ref 6.0–8.3)
Total Bilirubin: 0.5 mg/dL (ref 0.2–1.2)

## 2017-06-08 LAB — LIPID PANEL
Cholesterol: 166 mg/dL (ref 0–200)
HDL: 45.2 mg/dL (ref >39.00)
LDL CALC: 101 mg/dL — AB (ref 0–99)
NonHDL: 120.4
TRIGLYCERIDES: 95 mg/dL (ref 0.0–149.0)
Total CHOL/HDL Ratio: 4
VLDL: 19 mg/dL (ref 0.0–40.0)

## 2017-06-08 LAB — HEMOGLOBIN A1C: Hgb A1c MFr Bld: 5.9 % (ref 4.6–6.5)

## 2017-06-08 LAB — PSA: PSA: 2.78 ng/mL (ref 0.10–4.00)

## 2017-06-08 MED ORDER — LISINOPRIL 20 MG PO TABS: 20.0000 mg | ORAL_TABLET | Freq: Every day | ORAL | 0 refills | Status: DC

## 2017-06-08 NOTE — Progress Notes (Addendum)
Patient presents today for BP check. BP 144/78 after sitting for 10 minutes. Patient is taking his Lisinopril.  BP (!) 144/78 (BP Location: Right Arm, Patient Position: Sitting, Cuff Size: Normal)   Pulse 80   Please call pt and instruct him to increase to  lisinopril 10 mg to 2 tabs daily (at same time) to finish the medication he currently has at home.  The new increased dose of lisinopril 20 mg QD (1 tab) has been called in for him. When he gets the new bottle it will be 1 tab daily again.   Follow up in 4 weeks with provider appt.   Medical screening examination/treatment/procedure(s) were performed by non-physician practitioner and as supervising physician I was immediately available for consultation/collaboration.  Electronically Signed by: Felix Pacinienee Kuneff, DO [place] [facility]- OR

## 2017-06-08 NOTE — Addendum Note (Signed)
Addended by: Felix PaciniKUNEFF, RENEE A on: 06/08/2017 10:22 AM   Modules accepted: Orders, Level of Service

## 2017-06-08 NOTE — Telephone Encounter (Signed)
Spoke with patient reviewed  instructions. Patient verbalized understanding. Scheduled patient for follow up appt.

## 2017-06-08 NOTE — Telephone Encounter (Signed)
-----   Message from Natalia Leatherwoodenee A Kuneff, DO sent at 06/08/2017 10:22 AM EST ----- Please call pt back.

## 2017-06-09 LAB — HEPATITIS C ANTIBODY
Hepatitis C Ab: NONREACTIVE
SIGNAL TO CUT-OFF: 0.02 (ref <1.00)

## 2017-06-09 LAB — HIV ANTIBODY (ROUTINE TESTING W REFLEX): HIV: NONREACTIVE

## 2017-06-13 ENCOUNTER — Telehealth: Payer: Self-pay | Admitting: Family Medicine

## 2017-06-13 NOTE — Telephone Encounter (Signed)
[facility] with patient [facility] scheduled patient to come in for lab appt for microalbumin

## 2017-06-13 NOTE — Telephone Encounter (Signed)
Copied from CRM #[id number]. Topic: Quick Communication - See Telephone Encounter >> Jun 13, 2017  8:43 AM Rudi CocoLathan, Keontre Defino M, NT wrote: CRM for notification. See Telephone encounter for:   06/13/17.  Pt. Calling and is wanting lab results sent to his my chart acct. From last week. He talked with nurse about results but wants to see them on my chart. Pt. Also had a question about urine ratio and he said that a urine test wasn't done. Wants to know when he needs to come in to give a sample. Pt can be reached at [phone]

## 2017-06-14 ENCOUNTER — Other Ambulatory Visit (INDEPENDENT_AMBULATORY_CARE_PROVIDER_SITE_OTHER): Payer: BLUE CROSS/BLUE SHIELD

## 2017-06-14 DIAGNOSIS — I10 Essential (primary) hypertension: Secondary | ICD-10-CM | POA: Diagnosis not present

## 2017-06-14 LAB — MICROALBUMIN / CREATININE URINE RATIO
Creatinine,U: 56.2 mg/dL
MICROALB/CREAT RATIO: 1.2 mg/g (ref 0.0–30.0)

## 2017-06-15 ENCOUNTER — Other Ambulatory Visit: Payer: Self-pay | Admitting: *Deleted

## 2017-06-15 MED ORDER — LISINOPRIL 20 MG PO TABS: 20.0000 mg | ORAL_TABLET | Freq: Every day | ORAL | 0 refills | Status: DC

## 2017-07-09 ENCOUNTER — Ambulatory Visit: Payer: BLUE CROSS/BLUE SHIELD | Admitting: Family Medicine

## 2017-07-09 ENCOUNTER — Encounter: Payer: Self-pay | Admitting: Family Medicine

## 2017-07-09 VITALS — BP 132/79 | HR 69 | Temp 98.2°F | Ht 74.0 in | Wt 233.8 lb

## 2017-07-09 DIAGNOSIS — I10 Essential (primary) hypertension: Secondary | ICD-10-CM

## 2017-07-09 DIAGNOSIS — E785 Hyperlipidemia, unspecified: Secondary | ICD-10-CM | POA: Diagnosis not present

## 2017-07-09 MED ORDER — LISINOPRIL 20 MG PO TABS: 20.0000 mg | ORAL_TABLET | Freq: Every day | ORAL | 0 refills | Status: DC

## 2017-07-09 NOTE — Patient Instructions (Signed)
BP is good.  Low sodium diet and increase your exercise > 150 minutes a week.   Continue monitor blood pressure a couple times a week after medications (1-2 hours). If you are seeing readings > 135/85 then call in and be seen.
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
## 2017-08-13 DIAGNOSIS — K648 Other hemorrhoids: Secondary | ICD-10-CM | POA: Diagnosis not present

## 2017-08-13 DIAGNOSIS — Z86010 Personal history of colonic polyps: Secondary | ICD-10-CM | POA: Diagnosis not present

## 2017-08-13 DIAGNOSIS — K635 Polyp of colon: Secondary | ICD-10-CM | POA: Diagnosis not present

## 2017-08-13 LAB — HM COLONOSCOPY

## 2017-08-16 DIAGNOSIS — K635 Polyp of colon: Secondary | ICD-10-CM | POA: Diagnosis not present

## 2017-11-19 DIAGNOSIS — H2513 Age-related nuclear cataract, bilateral: Secondary | ICD-10-CM | POA: Diagnosis not present

## 2017-12-17 ENCOUNTER — Other Ambulatory Visit: Payer: Self-pay | Admitting: Family Medicine

## 2018-01-07 ENCOUNTER — Encounter: Payer: Self-pay | Admitting: Family Medicine

## 2018-01-07 ENCOUNTER — Ambulatory Visit: Payer: BLUE CROSS/BLUE SHIELD | Admitting: Family Medicine

## 2018-01-07 VITALS — BP 136/89 | HR 58 | Temp 97.8°F | Resp 20 | Ht 74.0 in | Wt 231.0 lb

## 2018-01-07 DIAGNOSIS — E875 Hyperkalemia: Secondary | ICD-10-CM

## 2018-01-07 DIAGNOSIS — I10 Essential (primary) hypertension: Secondary | ICD-10-CM | POA: Diagnosis not present

## 2018-01-07 DIAGNOSIS — R7303 Prediabetes: Secondary | ICD-10-CM | POA: Diagnosis not present

## 2018-01-07 DIAGNOSIS — E785 Hyperlipidemia, unspecified: Secondary | ICD-10-CM | POA: Diagnosis not present

## 2018-01-07 LAB — POCT GLYCOSYLATED HEMOGLOBIN (HGB A1C): Hemoglobin A1C: 5.1 % (ref 4.0–5.6)

## 2018-01-07 LAB — BASIC METABOLIC PANEL
BUN: 12 mg/dL (ref 6–23)
CO2: 32 meq/L (ref 19–32)
Calcium: 10.3 mg/dL (ref 8.4–10.5)
Chloride: 102 mEq/L (ref 96–112)
Creatinine, Ser: 1.32 mg/dL (ref 0.40–1.50)
GFR: 58.57 mL/min — ABNORMAL LOW (ref >60.00)
Glucose, Bld: 125 mg/dL — ABNORMAL HIGH (ref 70–99)
POTASSIUM: 5 meq/L (ref 3.5–5.1)
SODIUM: 137 meq/L (ref 135–145)

## 2018-01-07 MED ORDER — LISINOPRIL 30 MG PO TABS: 30.0000 mg | ORAL_TABLET | Freq: Every day | ORAL | 1 refills | Status: DC
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
## 2018-01-07 NOTE — Patient Instructions (Addendum)
Wait to pick up the new lisinopril until labs return  We will base med on potassium level.   Follow up in 6 mos. HTN --> can be CPE if due.    Watch the potassium content in your diet on lisinopril.   Please help us help you:  We are honored [facility] [place] [facility] your [facility]. Below you will find basic instructions that you may need to access in the future. Please help us help you by reading the instructions, which cover many of the frequent questions we experience.   Prescription refills and request:  -In order to allow more efficient response time, please call your pharmacy for all refills. They will forward the request electronically to us. This allows for the quickest possible response. Request left on a nurse line can take longer to refill, since these are checked as time allows between office patients and other phone calls.  - refill request can take up to 3-5 working days to complete.  - If request is sent electronically and request is appropiate, it is usually completed in 1-2 business days.  - all patients will need to be seen routinely for all chronic medical conditions requiring prescription medications (see follow-up below). If you are overdue for follow up on your condition, you will be asked to make an appointment and we will call in enough medication to cover you until your appointment (up to 30 days).  - all controlled substances will require a face to face visit to request/refill.  - if you desire your prescriptions to go through a new pharmacy, and have an active script at original pharmacy, you will need to call your pharmacy and have scripts transferred to new pharmacy. This is completed between the pharmacy locations and not by your provider.    Results: If any images or labs were ordered, it can take up to 1 week to get results depending on the test ordered and the lab/facility running and resulting the test. - Normal or stable results, which do  not need further discussion, may be released to your mychart immediately with attached note to you. A call may not be generated for normal results. Please make certain to sign up for mychart. If you have questions on how to activate your mychart you can call the front office.  - If your results need further discussion, our office will attempt to contact you via phone, and if unable to reach you after 2 attempts, we will release your abnormal result to your mychart with instructions.  - All results will be automatically released in mychart after 1 week.  - Your provider will provide you with explanation and instruction on all relevant material in your results. Please keep in mind, results and labs may appear confusing or abnormal to the untrained eye, but it does not mean they are actually abnormal for you personally. If you have any questions about your results that are not covered, or you desire more detailed explanation than what was provided, you should make an appointment with your provider to do so.   Our office handles many outgoing and incoming calls daily. If we have not contacted you within 1 week about your results, please check your mychart to see if there is a message first and if not, then contact our office.  In helping with this matter, you help decrease call volume, and therefore allow us to be able to respond to patients needs more efficiently.   Acute  office visits (sick visit):  An acute visit is intended for a new problem and are scheduled in shorter time slots to allow schedule openings for patients with new problems. This is the appropriate visit to discuss a new problem. Problems will not be addressed by phone call or Echart message. Appointment is needed if requesting treatment. In order to provide you with excellent quality medical care with proper time for you to explain your problem, have an exam and receive treatment with instructions, these appointments should be limited to one  new problem per visit. If you experience a new problem, in which you desire to be addressed, please make an acute office visit, we save openings on the schedule to accommodate you. Please do not save your new problem for any other type of visit, let us take care of it properly and quickly for you.   Follow up visits:  Depending on your condition(s) your provider will need to see you routinely in order to provide you with quality care and prescribe medication(s). Most chronic conditions (Example: hypertension, Diabetes, depression/anxiety... etc), require visits a couple times a year. Your provider will instruct you on proper follow up for your personal medical conditions and history. Please make certain to make follow up appointments for your condition as instructed. Failing to do so could result in lapse in your medication treatment/refills. If you request a refill, and are overdue to be seen on a condition, we will always provide you with a 30 day script (once) to allow you time to schedule.    Medicare wellness (well visit): - we have a wonderful Nurse Maudie Mercury), that will meet with you and provide you will yearly medicare wellness visits. These visits should occur yearly (can not be scheduled less than 1 calendar year apart) and cover preventive health, immunizations, advance directives and screenings you are entitled to yearly through your medicare benefits. Do not miss out on your entitled benefits, this is when medicare will pay for these benefits to be ordered for you.  These are strongly encouraged by your provider and is the appropriate type of visit to make certain you are up to date with all preventive health benefits. If you have not had your medicare wellness exam in the last 12 months, please make certain to schedule one by calling the office and schedule your medicare wellness with Maudie Mercury as soon as possible.   Yearly physical (well visit):  - Adults are recommended to be seen yearly for  physicals. Check with your insurance and date of your last physical, most insurances require one calendar year between physicals. Physicals include all preventive health topics, screenings, medical exam and labs that are appropriate for gender/age and history. You may have fasting labs needed at this visit. This is a well visit (not a sick visit), new problems should not be covered during this visit (see acute visit).  - Pediatric patients are seen more frequently when they are younger. Your provider will advise you on well child visit timing that is appropriate for your their age. - This is not a medicare wellness visit. Medicare wellness exams do not have an exam portion to the visit. Some medicare companies allow for a physical, some do not allow a yearly physical. If your medicare allows a yearly physical you can schedule the medicare wellness with our nurse Maudie Mercury and have your physical with your provider after, on the same day. Please check with insurance for your full benefits.   Late Policy/No Shows:  -  all new patients should arrive 15-30 minutes earlier than appointment to allow [place] time  to  obtain all personal demographics,  insurance information and for you to complete office paperwork. - All established patients should arrive 10-15 minutes earlier than appointment time to update all information and be checked in .  - In our best efforts to run on time, if you are late for your appointment you will be asked to either reschedule or if able, we will work you back into the schedule. There will be a wait time to work you back in the schedule,  depending on availability.  - If you are unable to make it to your appointment as scheduled, please call 24 hours ahead of time to allow [place] to fill the time slot with someone else who needs to be seen. If you do not cancel your appointment ahead of time, you may be charged a no show fee.

## 2018-05-14 DIAGNOSIS — M1712 Unilateral primary osteoarthritis, left knee: Secondary | ICD-10-CM | POA: Diagnosis not present

## 2018-07-10 ENCOUNTER — Ambulatory Visit (INDEPENDENT_AMBULATORY_CARE_PROVIDER_SITE_OTHER): Payer: BLUE CROSS/BLUE SHIELD | Admitting: Family Medicine

## 2018-07-10 ENCOUNTER — Encounter: Payer: Self-pay | Admitting: Family Medicine

## 2018-07-10 VITALS — BP 136/81 | HR 74 | Temp 97.9°F | Resp 16 | Ht 74.0 in | Wt 222.5 lb

## 2018-07-10 DIAGNOSIS — Z23 Encounter for immunization: Secondary | ICD-10-CM

## 2018-07-10 DIAGNOSIS — I10 Essential (primary) hypertension: Secondary | ICD-10-CM

## 2018-07-10 DIAGNOSIS — Z0000 Encounter for general adult medical examination without abnormal findings: Secondary | ICD-10-CM

## 2018-07-10 DIAGNOSIS — E663 Overweight: Secondary | ICD-10-CM

## 2018-07-10 DIAGNOSIS — Z125 Encounter for screening for malignant neoplasm of prostate: Secondary | ICD-10-CM

## 2018-07-10 DIAGNOSIS — Z131 Encounter for screening for diabetes mellitus: Secondary | ICD-10-CM | POA: Diagnosis not present

## 2018-07-10 DIAGNOSIS — E785 Hyperlipidemia, unspecified: Secondary | ICD-10-CM

## 2018-07-10 LAB — CBC
HCT: 44 % (ref 39.0–52.0)
Hemoglobin: 14.7 g/dL (ref 13.0–17.0)
MCHC: 33.5 g/dL (ref 30.0–36.0)
MCV: 87.8 fl (ref 78.0–100.0)
Platelets: 181 10*3/uL (ref 150.0–400.0)
RBC: 5.02 Mil/uL (ref 4.22–5.81)
RDW: 13.4 % (ref 11.5–15.5)
WBC: 3.8 10*3/uL — AB (ref 4.0–10.5)

## 2018-07-10 LAB — COMPREHENSIVE METABOLIC PANEL
ALBUMIN: 4.6 g/dL (ref 3.5–5.2)
ALK PHOS: 49 U/L (ref 39–117)
ALT: 20 U/L (ref 0–53)
AST: 18 U/L (ref 0–37)
BUN: 13 mg/dL (ref 6–23)
CALCIUM: 10.1 mg/dL (ref 8.4–10.5)
CO2: 28 mEq/L (ref 19–32)
Chloride: 101 mEq/L (ref 96–112)
Creatinine, Ser: 1.11 mg/dL (ref 0.40–1.50)
GFR: 67.19 mL/min (ref >60.00)
Glucose, Bld: 102 mg/dL — ABNORMAL HIGH (ref 70–99)
Potassium: 5.3 mEq/L — ABNORMAL HIGH (ref 3.5–5.1)
Sodium: 135 mEq/L (ref 135–145)
TOTAL PROTEIN: 7 g/dL (ref 6.0–8.3)
Total Bilirubin: 0.5 mg/dL (ref 0.2–1.2)

## 2018-07-10 LAB — LIPID PANEL
Cholesterol: 196 mg/dL (ref 0–200)
HDL: 49.7 mg/dL (ref >39.00)
LDL CALC: 127 mg/dL — AB (ref 0–99)
NONHDL: 146.49
TRIGLYCERIDES: 96 mg/dL (ref 0.0–149.0)
Total CHOL/HDL Ratio: 4
VLDL: 19.2 mg/dL (ref 0.0–40.0)

## 2018-07-10 LAB — HEMOGLOBIN A1C: Hgb A1c MFr Bld: 5.6 % (ref 4.6–6.5)

## 2018-07-10 LAB — TSH: TSH: 2.31 u[IU]/mL (ref 0.35–4.50)

## 2018-07-10 MED ORDER — LISINOPRIL 30 MG PO TABS: 30.0000 mg | ORAL_TABLET | Freq: Every day | ORAL | 1 refills | Status: DC

## 2018-07-10 NOTE — Patient Instructions (Addendum)
I have refilled your BP medication.  We will call you with all lab results and discuss any recommendations with you.    Keep up the weight loss.   Make a nurse visit in 3 mos for shingrix #2 by a nurse appt.   6 month followup on hypertension with provider.   1 yr cpe   Health Maintenance, Male A healthy lifestyle and preventive care is important for your health and wellness. Ask your health care provider about what schedule of regular examinations is right for you. What should I know about weight and diet? Eat a Healthy Diet  Eat plenty of vegetables, fruits, whole grains, low-fat dairy products, and lean protein.  Do not eat a lot of foods high in solid fats, added sugars, or salt.  Maintain a Healthy Weight Regular exercise can help you achieve or maintain a healthy weight. You should:  Do at least 150 minutes of exercise each week. The exercise should increase your heart rate and make you sweat (moderate-intensity exercise).  Do strength-training exercises at least twice a week. Watch Your Levels of Cholesterol and Blood Lipids  Have your blood tested for lipids and cholesterol every 5 years starting at 62 years of age. If you are at high risk for heart disease, you should start having your blood tested when you are 62 years old. You may need to have your cholesterol levels checked more often if: ? Your lipid or cholesterol levels are high. ? You are older than 62 years of age. ? You are at high risk for heart disease. What should I know about cancer screening? Many types of cancers can be detected early and may often be prevented. Lung Cancer  You should be screened every year for lung cancer if: ? You are a current smoker who has smoked for at least 30 years. ? You are a former smoker who has quit within the past 15 years.  Talk to your health care provider about your screening options, when you should start screening, and how often you should be screened. Colorectal  Cancer  Routine colorectal cancer screening usually begins at 62 years of age and should be repeated every 5-10 years until you are 62 years old. You may need to be screened more often if early forms of precancerous polyps or small growths are found. Your health care provider may recommend screening at an earlier age if you have risk factors for colon cancer.  Your health care provider may recommend using home test kits to check for hidden blood in the stool.  A small camera at the end of a tube can be used to examine your colon (sigmoidoscopy or colonoscopy). This checks for the earliest forms of colorectal cancer. Prostate and Testicular Cancer  Depending on your age and overall health, your health care provider may do certain tests to screen for prostate and testicular cancer.  Talk to your health care provider about any symptoms or concerns you have about testicular or prostate cancer. Skin Cancer  Check your skin from head to toe regularly.  Tell your health care provider about any new moles or changes in moles, especially if: ? There is a change in a mole's size, shape, or color. ? You have a mole that is larger than a pencil eraser.  Always use sunscreen. Apply sunscreen liberally and repeat throughout the day.  Protect yourself by wearing long sleeves, pants, a wide-brimmed hat, and sunglasses when outside. What should I know about heart disease, diabetes,  and high blood pressure?  If you are 5718-62 years of age, have your blood pressure checked every 3-5 years. If you are 62 years of age or older, have your blood pressure checked every year. You should have your blood pressure measured twice-once when you are at a hospital or clinic, and once when you are not at a hospital or clinic. Record the average of the two measurements. To check your blood pressure when you are not at a hospital or clinic, you can use: ? An automated blood pressure machine at a pharmacy. ? A home blood  pressure monitor.  Talk to your health care provider about your target blood pressure.  If you are between 5245-62 years old, ask your health care provider if you should take aspirin to prevent heart disease.  Have regular diabetes screenings by checking your fasting blood sugar level. ? If you are at a normal weight and have a low risk for diabetes, have this test once every three years after the age of 62. ? If you are overweight and have a high risk for diabetes, consider being tested at a younger age or more often.  A one-time screening for abdominal aortic aneurysm (AAA) by ultrasound is recommended for men aged 65-75 years who are current or former smokers. What should I know about preventing infection? Hepatitis B If you have a higher risk for hepatitis B, you should be screened for this virus. Talk with your health care provider to find out if you are at risk for hepatitis B infection. Hepatitis C Blood testing is recommended for:  Everyone born from [birth year] through 1964.  Anyone with known risk factors for hepatitis C. Sexually Transmitted Diseases (STDs)  You should be screened each year for STDs including gonorrhea and chlamydia if: ? You are sexually active and are younger than 62 years of age. ? You are older than 62 years of age and your health care provider tells you that you are at risk for this type of infection. ? Your sexual activity has changed since you were last screened and you are at an increased risk for chlamydia or gonorrhea. Ask your health care provider if you are at risk.  Talk with your health care provider about whether you are at high risk of being infected with HIV. Your health care provider may recommend a prescription medicine to help prevent HIV infection. What else can I do?  Schedule regular health, dental, and eye exams.  Stay current with your vaccines (immunizations).  Do not use any tobacco products, such as cigarettes, chewing tobacco, and  e-cigarettes. If you need help quitting, ask your health care provider.  Limit alcohol intake to no more than 2 drinks per day. One drink equals 12 ounces of beer, 5 ounces of wine, or 1 ounces of hard liquor.  Do not use street drugs.  Do not share needles.  Ask your health care provider for help if you need support or information about quitting drugs.  Tell your health care provider if you often feel depressed.  Tell your health care provider if you have ever been abused or do not feel safe at home. This information is not intended to replace advice given to you by your health care provider. Make sure you discuss any questions you have with your health care provider. Document Released: 11/11/2007 Document Revised: 01/12/2016 Document Reviewed: 02/16/2015 [employer] Interactive Patient Education  2019 [employer]
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
## 2018-07-11 ENCOUNTER — Telehealth: Payer: Self-pay | Admitting: Family Medicine

## 2018-07-11 ENCOUNTER — Encounter: Payer: Self-pay | Admitting: Family Medicine

## 2018-07-11 NOTE — Telephone Encounter (Signed)
Please inform patient the following information: His labs all look good. His potassium is just mildly above normal range at 5.3 (3.5-5.1 WNL). He has had higher potassium in the past.  We have discussed trying a different med for Bp than lisinopril (since it can cause one to hold on to potassium).  I would suggest continue to avoid high potassium containing foods and salt substitutes which are potassium rich. We will monitor the level next appt 6 months and if need be switch the med then.

## 2018-07-11 NOTE — Telephone Encounter (Signed)
My Chart message sent to patient and VM left on pts cellphone letting him know to call back for results or check Mychart.

## 2018-07-29 ENCOUNTER — Encounter: Payer: Self-pay | Admitting: Family Medicine

## 2018-08-08 ENCOUNTER — Other Ambulatory Visit: Payer: Self-pay

## 2018-08-08 ENCOUNTER — Ambulatory Visit (INDEPENDENT_AMBULATORY_CARE_PROVIDER_SITE_OTHER): Payer: BLUE CROSS/BLUE SHIELD | Admitting: Family Medicine

## 2018-08-08 ENCOUNTER — Encounter: Payer: Self-pay | Admitting: Family Medicine

## 2018-08-08 VITALS — BP 131/86 | HR 64 | Temp 98.2°F | Resp 16 | Ht 74.0 in | Wt 227.0 lb

## 2018-08-08 DIAGNOSIS — H6122 Impacted cerumen, left ear: Secondary | ICD-10-CM | POA: Diagnosis not present

## 2018-08-08 DIAGNOSIS — H9192 Unspecified hearing loss, left ear: Secondary | ICD-10-CM | POA: Diagnosis not present

## 2018-08-08 MED ORDER — CARBAMIDE PEROXIDE 6.5 % OT SOLN: 5.0000 [drp] | Freq: Every day | OTIC | 0 refills | Status: DC

## 2018-08-08 NOTE — Patient Instructions (Addendum)
Use the debrox solution daily to soften. Referral to ENT for removal of cerumen.  NO Q-tips.    Earwax Buildup, Adult The ears produce a substance called earwax that helps keep bacteria out of the ear and protects the skin in the ear canal. Occasionally, earwax can build up in the ear and cause discomfort or hearing loss. What increases the risk? This condition is more likely to develop in people who:  Are male.  Are elderly.  Naturally produce more earwax.  Clean their ears often with cotton swabs.  Use earplugs often.  Use in-ear headphones often.  Wear hearing aids.  Have narrow ear canals.  Have earwax that is overly thick or sticky.  Have eczema.  Are dehydrated.  Have excess hair in the ear canal. What are the signs or symptoms? Symptoms of this condition include:  Reduced or muffled hearing.  A feeling of fullness in the ear or feeling that the ear is plugged.  Fluid coming from the ear.  Ear pain.  Ear itch.  Ringing in the ear.  Coughing.  An obvious piece of earwax that can be seen inside the ear canal. How is this diagnosed? This condition may be diagnosed based on:  Your symptoms.  Your medical history.  An ear exam. During the exam, your health care provider will look into your ear with an instrument called an otoscope. You may have tests, including a hearing test. How is this treated? This condition may be treated by:  Using ear drops to soften the earwax.  Having the earwax removed by a health care provider. The health care provider may: ? Flush the ear with water. ? Use an instrument that has a loop on the end (curette). ? Use a suction device.  Surgery to remove the wax buildup. This may be done in severe cases. Follow these instructions at home:   Take over-the-counter and prescription medicines only as told by your health care provider.  Do not put any objects, including cotton swabs, into your ear. You can clean the  opening of your ear canal with a washcloth or facial tissue.  Follow instructions from your health care provider about cleaning your ears. Do not over-clean your ears.  Drink enough fluid to keep your urine clear or pale yellow. This will help to thin the earwax.  Keep all follow-up visits as told by your health care provider. If earwax builds up in your ears often or if you use hearing aids, consider seeing your health care provider for routine, preventive ear cleanings. Ask your health care provider how often you should schedule your cleanings.  If you have hearing aids, clean them according to instructions from the manufacturer and your health care provider. Contact a health care provider if:  You have ear pain.  You develop a fever.  You have blood, pus, or other fluid coming from your ear.  You have hearing loss.  You have ringing in your ears that does not go away.  Your symptoms do not improve with treatment.  You feel like the room is spinning (vertigo). Summary  Earwax can build up in the ear and cause discomfort or hearing loss.  The most common symptoms of this condition include reduced or muffled hearing and a feeling of fullness in the ear or feeling that the ear is plugged.  This condition may be diagnosed based on your symptoms, your medical history, and an ear exam.  This condition may be treated by using ear drops  to soften the earwax or by having the earwax removed by a health care provider.  Do not put any objects, including cotton swabs, into your ear. You can clean the opening of your ear canal with a washcloth or facial tissue. This information is not intended to replace advice given to you by your health care provider. Make sure you discuss any questions you have with your health care provider. Document Released: 06/22/2004 Document Revised: 04/26/2017 Document Reviewed: 07/26/2016 Elsevier Interactive Patient Education  2019 [employer].
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
## 2018-10-07 ENCOUNTER — Other Ambulatory Visit: Payer: Self-pay

## 2018-10-07 ENCOUNTER — Ambulatory Visit (INDEPENDENT_AMBULATORY_CARE_PROVIDER_SITE_OTHER): Payer: BLUE CROSS/BLUE SHIELD

## 2018-10-07 DIAGNOSIS — Z23 Encounter for immunization: Secondary | ICD-10-CM

## 2018-10-07 NOTE — Progress Notes (Signed)
Patient in for Shingrix #2.  Shingrix vaccine 0.39ml administered IM in left deltoid without complication.  VIS provided to patient.   Weldon Picking, RN

## 2018-10-08 ENCOUNTER — Ambulatory Visit: Payer: BLUE CROSS/BLUE SHIELD

## 2019-01-09 ENCOUNTER — Other Ambulatory Visit: Payer: Self-pay

## 2019-01-09 ENCOUNTER — Ambulatory Visit (INDEPENDENT_AMBULATORY_CARE_PROVIDER_SITE_OTHER): Payer: BC Managed Care – PPO | Admitting: Family Medicine

## 2019-01-09 ENCOUNTER — Encounter: Payer: Self-pay | Admitting: Family Medicine

## 2019-01-09 VITALS — BP 134/87 | HR 65 | Temp 98.0°F | Resp 16 | Ht 74.0 in | Wt 230.0 lb

## 2019-01-09 DIAGNOSIS — E875 Hyperkalemia: Secondary | ICD-10-CM | POA: Diagnosis not present

## 2019-01-09 DIAGNOSIS — E785 Hyperlipidemia, unspecified: Secondary | ICD-10-CM

## 2019-01-09 DIAGNOSIS — I10 Essential (primary) hypertension: Secondary | ICD-10-CM

## 2019-01-09 LAB — BASIC METABOLIC PANEL
BUN: 17 mg/dL (ref 6–23)
CO2: 26 mEq/L (ref 19–32)
Calcium: 9.8 mg/dL (ref 8.4–10.5)
Chloride: 106 mEq/L (ref 96–112)
Creatinine, Ser: 1.13 mg/dL (ref 0.40–1.50)
GFR: 65.71 mL/min (ref >60.00)
Glucose, Bld: 93 mg/dL (ref 70–99)
Potassium: 5.1 mEq/L (ref 3.5–5.1)
Sodium: 138 mEq/L (ref 135–145)
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
## 2019-01-09 NOTE — Patient Instructions (Signed)
If your potassium is elevated we will need to change your medication- amlodipine is a good option.   Get back to exercising routinely- at least 150 min a week.

## 2019-01-10 ENCOUNTER — Telehealth: Payer: Self-pay | Admitting: Family Medicine

## 2019-01-10 DIAGNOSIS — I10 Essential (primary) hypertension: Secondary | ICD-10-CM

## 2019-01-10 MED ORDER — LISINOPRIL 30 MG PO TABS: 30.0000 mg | ORAL_TABLET | Freq: Every day | ORAL | 1 refills | Status: DC

## 2019-01-10 NOTE — Telephone Encounter (Signed)
Pt was called and given information, he verbalized understanding  

## 2019-01-10 NOTE — Telephone Encounter (Signed)
Please inform patient the following information: His potassium is normal -highest end of normal- but normal.  I have refilled his BP med. No changes for now. Continue to monitor BP. If routinely above 354 systolic (top) and/or 90 diastolic(bottom) then follow up sooner than his routine scheduled appt. Work on [employer] and exercise as we discussed. If borderline or above goal next appt again, we will discuss adding on another med if needed.

## 2019-01-16 ENCOUNTER — Ambulatory Visit (INDEPENDENT_AMBULATORY_CARE_PROVIDER_SITE_OTHER): Payer: BC Managed Care – PPO | Admitting: Podiatry

## 2019-01-16 ENCOUNTER — Encounter: Payer: Self-pay | Admitting: Podiatry

## 2019-01-16 ENCOUNTER — Other Ambulatory Visit: Payer: Self-pay

## 2019-01-16 VITALS — Temp 97.1°F

## 2019-01-16 DIAGNOSIS — S99922A Unspecified injury of left foot, initial encounter: Secondary | ICD-10-CM | POA: Diagnosis not present

## 2019-01-16 DIAGNOSIS — L601 Onycholysis: Secondary | ICD-10-CM | POA: Diagnosis not present

## 2019-01-16 DIAGNOSIS — L03032 Cellulitis of left toe: Secondary | ICD-10-CM | POA: Diagnosis not present

## 2019-01-16 DIAGNOSIS — L603 Nail dystrophy: Secondary | ICD-10-CM | POA: Diagnosis not present

## 2019-01-16 NOTE — Patient Instructions (Signed)
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
## 2019-01-21 ENCOUNTER — Encounter: Payer: Self-pay | Admitting: Podiatry

## 2019-01-23 ENCOUNTER — Other Ambulatory Visit: Payer: BC Managed Care – PPO

## 2019-01-31 ENCOUNTER — Telehealth: Payer: Self-pay | Admitting: *Deleted

## 2019-01-31 NOTE — Telephone Encounter (Signed)
-----   Message from Trula Slade, DPM sent at 01/29/2019  6:34 PM EDT ----- Val- please let him know the culture did not show fungus. It did show paronychia and damage to the nail. He cancelled his follow up because it was doing well. Can you call him just to let him know there was no fungus. Also due to the paronychia can you make sure there is no redness, drainage/pus, warmth or other signs of infection? Thanks.

## 2019-01-31 NOTE — Telephone Encounter (Signed)
Pt called and informed of Dr. Leigh Aurora review of results and asked status of toenail. Pt states it is doing very well and he is following all of Dr. Leigh Aurora instructions.

## 2019-01-31 NOTE — Telephone Encounter (Signed)
Left message requesting a call back to discuss labs. 

## 2019-02-11 DIAGNOSIS — Z23 Encounter for immunization: Secondary | ICD-10-CM | POA: Diagnosis not present

## 2019-06-17 ENCOUNTER — Other Ambulatory Visit: Payer: BC Managed Care – PPO

## 2019-06-25 ENCOUNTER — Ambulatory Visit: Payer: BC Managed Care – PPO | Attending: Internal Medicine

## 2019-06-25 DIAGNOSIS — Z20822 Contact with and (suspected) exposure to covid-19: Secondary | ICD-10-CM

## 2019-06-26 LAB — NOVEL CORONAVIRUS, NAA: SARS-CoV-2, NAA: NOT DETECTED

## 2019-06-27 ENCOUNTER — Other Ambulatory Visit: Payer: BC Managed Care – PPO

## 2019-07-14 ENCOUNTER — Other Ambulatory Visit: Payer: Self-pay

## 2019-07-14 ENCOUNTER — Ambulatory Visit (INDEPENDENT_AMBULATORY_CARE_PROVIDER_SITE_OTHER): Payer: BC Managed Care – PPO | Admitting: Family Medicine

## 2019-07-14 ENCOUNTER — Encounter: Payer: Self-pay | Admitting: Family Medicine

## 2019-07-14 VITALS — BP 138/86 | HR 85 | Temp 97.4°F | Resp 16 | Ht 74.0 in | Wt 224.1 lb

## 2019-07-14 DIAGNOSIS — G720 Drug-induced myopathy: Secondary | ICD-10-CM | POA: Insufficient documentation

## 2019-07-14 DIAGNOSIS — E785 Hyperlipidemia, unspecified: Secondary | ICD-10-CM | POA: Diagnosis not present

## 2019-07-14 DIAGNOSIS — E663 Overweight: Secondary | ICD-10-CM

## 2019-07-14 DIAGNOSIS — R109 Unspecified abdominal pain: Secondary | ICD-10-CM | POA: Diagnosis not present

## 2019-07-14 DIAGNOSIS — Z125 Encounter for screening for malignant neoplasm of prostate: Secondary | ICD-10-CM | POA: Diagnosis not present

## 2019-07-14 DIAGNOSIS — Z131 Encounter for screening for diabetes mellitus: Secondary | ICD-10-CM | POA: Diagnosis not present

## 2019-07-14 DIAGNOSIS — E875 Hyperkalemia: Secondary | ICD-10-CM | POA: Diagnosis not present

## 2019-07-14 DIAGNOSIS — E669 Obesity, unspecified: Secondary | ICD-10-CM | POA: Insufficient documentation

## 2019-07-14 DIAGNOSIS — R351 Nocturia: Secondary | ICD-10-CM

## 2019-07-14 DIAGNOSIS — I10 Essential (primary) hypertension: Secondary | ICD-10-CM

## 2019-07-14 DIAGNOSIS — Z0000 Encounter for general adult medical examination without abnormal findings: Secondary | ICD-10-CM

## 2019-07-14 DIAGNOSIS — Z789 Other specified health status: Secondary | ICD-10-CM

## 2019-07-14 LAB — COMPREHENSIVE METABOLIC PANEL
ALT: 20 U/L (ref 0–53)
AST: 18 U/L (ref 0–37)
Albumin: 4.6 g/dL (ref 3.5–5.2)
Alkaline Phosphatase: 53 U/L (ref 39–117)
BUN: 10 mg/dL (ref 6–23)
CO2: 28 mEq/L (ref 19–32)
Calcium: 10 mg/dL (ref 8.4–10.5)
Chloride: 95 mEq/L — ABNORMAL LOW (ref 96–112)
Creatinine, Ser: 1.04 mg/dL (ref 0.40–1.50)
GFR: 72.2 mL/min (ref >60.00)
Glucose, Bld: 106 mg/dL — ABNORMAL HIGH (ref 70–99)
Potassium: 5.4 mEq/L — ABNORMAL HIGH (ref 3.5–5.1)
Sodium: 127 mEq/L — ABNORMAL LOW (ref 135–145)
Total Bilirubin: 0.5 mg/dL (ref 0.2–1.2)
Total Protein: 6.8 g/dL (ref 6.0–8.3)

## 2019-07-14 LAB — CBC
HCT: 42.8 % (ref 39.0–52.0)
Hemoglobin: 14.4 g/dL (ref 13.0–17.0)
MCHC: 33.8 g/dL (ref 30.0–36.0)
MCV: 86 fl (ref 78.0–100.0)
Platelets: 194 10*3/uL (ref 150.0–400.0)
RBC: 4.98 Mil/uL (ref 4.22–5.81)
RDW: 13.2 % (ref 11.5–15.5)
WBC: 3.9 10*3/uL — ABNORMAL LOW (ref 4.0–10.5)

## 2019-07-14 LAB — LIPID PANEL
Cholesterol: 160 mg/dL (ref 0–200)
HDL: 47.3 mg/dL (ref >39.00)
LDL Cholesterol: 97 mg/dL (ref 0–99)
NonHDL: 112.37
Total CHOL/HDL Ratio: 3
Triglycerides: 79 mg/dL (ref 0.0–149.0)
VLDL: 15.8 mg/dL (ref 0.0–40.0)

## 2019-07-14 LAB — PSA: PSA: 2.26 ng/mL (ref 0.10–4.00)

## 2019-07-14 LAB — TSH: TSH: 1.67 u[IU]/mL (ref 0.35–4.50)

## 2019-07-14 LAB — HEMOGLOBIN A1C: Hgb A1c MFr Bld: 6 % (ref 4.6–6.5)

## 2019-07-14 MED ORDER — LISINOPRIL 30 MG PO TABS: 30.0000 mg | ORAL_TABLET | Freq: Every day | ORAL | 1 refills | Status: DC
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
## 2019-07-14 NOTE — Patient Instructions (Addendum)
COVID-19 Vaccine Information can be found at: [email or web address] For questions related to vaccine distribution or appointments, please email [email or web address] or call [phone].  Covid Vaccine appointment go to [email or web address].  Health Maintenance, Male Adopting a healthy lifestyle and getting preventive care are important in promoting health and wellness. Ask your health care provider about:  The right schedule for you to have regular tests and exams.  Things you can do on your own to prevent diseases and keep yourself healthy. What should I know about diet, weight, and exercise? Eat a healthy diet   Eat a diet that includes plenty of vegetables, fruits, low-fat dairy products, and lean protein.  Do not eat a lot of foods that are high in solid fats, added sugars, or sodium. Maintain a healthy weight Body mass index (BMI) is a measurement that can be used to identify possible weight problems. It estimates body fat based on height and weight. Your health care provider can help determine your BMI and help you achieve or maintain a healthy weight. Get regular exercise Get regular exercise. This is one of the most important things you can do for your health. Most adults should:  Exercise for at least 150 minutes each week. The exercise should increase your heart rate and make you sweat (moderate-intensity exercise).  Do strengthening exercises at least twice a week. This is in addition to the moderate-intensity exercise.  Spend less time sitting. Even light physical activity can be beneficial. Watch cholesterol and blood lipids Have your blood tested for lipids and cholesterol at 63 years of age, then have this test every 5 years. You may need to have your cholesterol levels checked more often if:  Your lipid or cholesterol levels are high.  You are older than 63 years of age.  You are at high risk for heart  disease. What should I know about cancer screening? Many types of cancers can be detected early and may often be prevented. Depending on your health history and family history, you may need to have cancer screening at various ages. This may include screening for:  Colorectal cancer.  Prostate cancer.  Skin cancer.  Lung cancer. What should I know about heart disease, diabetes, and high blood pressure? Blood pressure and heart disease  High blood pressure causes heart disease and increases the risk of stroke. This is more likely to develop in people who have high blood pressure readings, are of African descent, or are overweight.  Talk with your health care provider about your target blood pressure readings.  Have your blood pressure checked: ? Every 3-5 years if you are 18-39 years of age. ? Every year if you are 40 years old or older.  If you are between the ages of 65 and 75 and are a current or former smoker, ask your health care provider if you should have a one-time screening for abdominal aortic aneurysm (AAA). Diabetes Have regular diabetes screenings. This checks your fasting blood sugar level. Have the screening done:  Once every three years after age 45 if you are at a normal weight and have a low risk for diabetes.  More often and at a younger age if you are overweight or have a high risk for diabetes. What should I know about preventing infection? Hepatitis B If you have a higher risk for hepatitis B, you should be screened for this virus. Talk with your health care provider to find out if you are at risk for hepatitis B infection. Hepatitis C Blood testing   is recommended for:  Everyone born from 1945 through 1965.  Anyone with known risk factors for hepatitis C. Sexually transmitted infections (STIs)  You should be screened each year for STIs, including gonorrhea and chlamydia, if: ? You are sexually active and are younger than 63 years of age. ? You are older  than 63 years of age and your health care provider tells you that you are at risk for this type of infection. ? Your sexual activity has changed since you were last screened, and you are at increased risk for chlamydia or gonorrhea. Ask your health care provider if you are at risk.  Ask your health care provider about whether you are at high risk for HIV. Your health care provider may recommend a prescription medicine to help prevent HIV infection. If you choose to take medicine to prevent HIV, you should first get tested for HIV. You should then be tested every 3 months for as long as you are taking the medicine. Follow these instructions at home: Lifestyle  Do not use any products that contain nicotine or tobacco, such as cigarettes, e-cigarettes, and chewing tobacco. If you need help quitting, ask your health care provider.  Do not use street drugs.  Do not share needles.  Ask your health care provider for help if you need support or information about quitting drugs. Alcohol use  Do not drink alcohol if your health care provider tells you not to drink.  If you drink alcohol: ? Limit how much you have to 0-2 drinks a day. ? Be aware of how much alcohol is in your drink. In the [place] one drink equals one 12 oz bottle of beer (355 mL), one 5 oz glass of wine (148 mL), or one 1 oz glass of hard liquor (44 mL). General instructions  Schedule regular health, dental, and eye exams.  Stay current with your vaccines.  Tell your health care provider if: ? You often feel depressed. ? You have ever been abused or do not feel safe at home. Summary  Adopting a healthy lifestyle and getting preventive care are important in promoting health and wellness.  Follow your health care provider's instructions about healthy diet, exercising, and getting tested or screened for diseases.  Follow your health care provider's instructions on monitoring your cholesterol and blood pressure. This  information is not intended to replace advice given to you by your health care provider. Make sure you discuss any questions you have with your health care provider. Document Revised: 05/08/2018 Document Reviewed: 05/08/2018 Elsevier Patient Education  2020 Elsevier Inc.  

## 2019-07-15 ENCOUNTER — Telehealth: Payer: Self-pay | Admitting: Family Medicine

## 2019-07-15 MED ORDER — LISINOPRIL 10 MG PO TABS: 10.0000 mg | ORAL_TABLET | Freq: Every day | ORAL | 0 refills | Status: DC

## 2019-07-15 MED ORDER — AMLODIPINE BESYLATE 5 MG PO TABS: 5.0000 mg | ORAL_TABLET | Freq: Every day | ORAL | 0 refills | Status: DC

## 2019-07-15 NOTE — Telephone Encounter (Signed)
Pt is eating a high potassium diet and is going to cut back. He eats a lot of beans, nuts, bananas, etc. He feels this could be hurting his potassium level on top the BP medications. Pt was given all instructions and will call back if needed. He was able to repeat instructions back to me. Appt scheduled.

## 2019-07-15 NOTE — Telephone Encounter (Signed)
Please inform patient: -His cholesterol panel looks great.  Everything at goal. -His blood cell counts are stable from prior collections. -Thyroid is functioning normally. -Prostate cancer screening is in normal range. - His A1c/diabetes screen did increase from 5.6 to 6.0>> this is in the "prediabetic "range.  Diabetes is diagnosed with an A1c greater than 6.4 and a fasting glucose greater than 126.  No medications are needed at this time however - I would recommend a mediterranean diet and regular exercise.  A mediterranean diet is high in fruits, vegetables, whole grains, fish, chicken, nuts, healthy fats (olive oil or canola oil). Low fat dairy. There are many online resources and books on this diet. Limit butter, margarine, red meat and sweets.  We will follow this more closely at his follow-up in 6 months.  Lastly, his potassium is elevated again to 5.4.  I would recommend moving forward with the changes we discussed on his blood pressure regimen.  If he is willing to do so we will call back with medication instructions.    -Also if he would like to do so, please ask him if he has picked up his new prescription of lisinopril yet?

## 2019-07-15 NOTE — Telephone Encounter (Signed)
Please provide patient with the following instructions: Discontinue lisinopril 30 mg daily.  Start amlodipine 5 mg daily and new prescription for lisinopril 10 mg daily.   -Monitor blood pressures at home and write them down.  Blood pressure should be monitored at least 2 hours after taking blood pressure medications, while sitting down, relaxing for at least 10 minutes.   -If after 3-4 days if blood pressures are routinely above 135/85 on the above regimen, increase the amlodipine dose to 1.5 tabs. >>  Monitor again for 3-4 days and if still not at goal call him for further instructions.  -Please make sure he checks his lisinopril medication when he picks it up from the pharmacy and it is the lisinopril 10 mg.    Follow-up in 4 weeks with provider appointment to recheck blood pressure on new regimen and recheck potassium on new regimen

## 2019-07-15 NOTE — Telephone Encounter (Signed)
Pt was called and given information. He has not picked up the Lisinopril yet and would like to start the new regimen that was discussed at visit.  Pt advised not to pick up medication yet and he would be called back soon with new instructions. He verbalized understanding

## 2019-07-15 NOTE — Addendum Note (Signed)
Addended by: Felix Pacini A on: 07/15/2019 10:30 AM   Modules accepted: Orders

## 2019-07-22 ENCOUNTER — Telehealth: Payer: Self-pay

## 2019-07-22 DIAGNOSIS — I10 Essential (primary) hypertension: Secondary | ICD-10-CM

## 2019-07-22 NOTE — Telephone Encounter (Signed)
Pt was called and given information, he verbalized understanding  

## 2019-07-22 NOTE — Telephone Encounter (Signed)
These clarify with patient how he is taking his meds.  -  If he is taking amlodipine 1.5 tabs (7.5 mg) and lisinopril 10 mg (1 tab) and his blood pressures are still above goal >>> he is to continue the amlodipine 1.5 tabs (7.5 mg total) and increase his lisinopril to 2 tabs (20 mg total).  I believe this final change should get his blood pressure perfect.

## 2019-07-22 NOTE — Telephone Encounter (Signed)
Patient states he was seen on 2/15 by Dr. Claiborne Billings. He was prescribed new blood pressure medication. He feels that this med is not working.  He has several increased high pressure readings.  Please advise.  Patient can be contacted at [phone].

## 2019-07-22 NOTE — Telephone Encounter (Signed)
Pt is taking meds at night around 8am. Taking BP at 10pm and readings have been high. Started medicatons on 07/15/2019 and have been 140's over 80's every night. Pt started taking 1.5 tablets of Amlodipine on 20th.  Please advise.

## 2019-08-12 ENCOUNTER — Encounter: Payer: Self-pay | Admitting: Family Medicine

## 2019-08-12 ENCOUNTER — Ambulatory Visit (INDEPENDENT_AMBULATORY_CARE_PROVIDER_SITE_OTHER): Payer: BC Managed Care – PPO | Admitting: Family Medicine

## 2019-08-12 ENCOUNTER — Other Ambulatory Visit: Payer: Self-pay

## 2019-08-12 VITALS — BP 130/84 | HR 69 | Temp 97.8°F | Resp 16 | Ht 74.0 in | Wt 220.5 lb

## 2019-08-12 DIAGNOSIS — E663 Overweight: Secondary | ICD-10-CM

## 2019-08-12 DIAGNOSIS — E875 Hyperkalemia: Secondary | ICD-10-CM

## 2019-08-12 DIAGNOSIS — I10 Essential (primary) hypertension: Secondary | ICD-10-CM

## 2019-08-12 DIAGNOSIS — E785 Hyperlipidemia, unspecified: Secondary | ICD-10-CM | POA: Diagnosis not present

## 2019-08-12 LAB — BASIC METABOLIC PANEL
BUN: 11 mg/dL (ref 6–23)
CO2: 28 mEq/L (ref 19–32)
Calcium: 10 mg/dL (ref 8.4–10.5)
Chloride: 102 mEq/L (ref 96–112)
Creatinine, Ser: 1.03 mg/dL (ref 0.40–1.50)
GFR: 72.99 mL/min (ref >60.00)
Glucose, Bld: 111 mg/dL — ABNORMAL HIGH (ref 70–99)
Potassium: 5.4 mEq/L — ABNORMAL HIGH (ref 3.5–5.1)
Sodium: 136 mEq/L (ref 135–145)

## 2019-08-12 NOTE — Patient Instructions (Addendum)
Continue to avoid high potassium foods in large amounts. You can still enjoy the foods you love that have potassium- just not large amounts daily.  We will recheck your labs today- if potassium is still elevated we can consider discontinuing lisinopril all together and start a different medication with the amlodipine.    Hyperkalemia Hyperkalemia is when you have too much potassium in your blood. Potassium helps your body in many ways, but having too much can cause problems. If there is too much potassium in your blood, it can affect how your heart works. Potassium is normally removed from your body by your kidneys. Many things can cause the amount in your blood to be high. Medicines and other treatments can be used to bring the amount to a normal level. Treatment may need to be done in the hospital. Follow these instructions at home:   Take over-the-counter and prescription medicines only as told by your doctor.  Do not take any of the following unless your doctor says it is okay: ? Supplements. ? Natural products. ? Herbs. ? Vitamins.  Limit how much alcohol you drink as told by your doctor.  Do not use drugs. If you need help quitting, ask your doctor.  If you have kidney disease, you may need to follow a low-potassium diet. A food specialist (dietitian) can help you.  Keep all follow-up visits as told by your doctor. This is important. Contact a doctor if:  Your heartbeat is not regular or is very slow.  You feel dizzy (light-headed).  You feel weak.  You feel sick to your stomach (nauseous).  You have tingling in your hands or feet.  You lose feeling (have numbness) in your hands or feet. Get help right away if:  You are short of breath.  You have chest pain.  You pass out (faint).  You cannot move your muscles. Summary  Hyperkalemia is when you have too much potassium in your blood.  Take over-the-counter and prescription medicines only as told by your  doctor.  Limit how much alcohol you drink as told by your doctor.  Contact a doctor if your heartbeat is not regular. This information is not intended to replace advice given to you by your health care provider. Make sure you discuss any questions you have with your health care provider. Document Revised: 04/30/2017 Document Reviewed: 04/30/2017 Elsevier Patient Education  2020 [employer].
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
## 2019-08-13 ENCOUNTER — Telehealth: Payer: Self-pay | Admitting: Family Medicine

## 2019-08-13 DIAGNOSIS — E875 Hyperkalemia: Secondary | ICD-10-CM

## 2019-08-13 MED ORDER — FUROSEMIDE 20 MG PO TABS: 10.0000 mg | ORAL_TABLET | Freq: Every day | ORAL | 3 refills | Status: DC

## 2019-08-13 NOTE — Telephone Encounter (Signed)
I received a phone note with nothing written. Was this in error or for his lab results from yesterday?

## 2019-08-13 NOTE — Telephone Encounter (Signed)
Pt was called today and discussed his hyperkalemia and BP regimen.  He reviewed some of his old records he had at home and he reports a potassium level 5.0-5.4 chronically over those years. We discussed possible endocrine disorders as cause. For now it was decided to increase amlodipine to 10 mg daily, decrease lisinopril to 10 mg, add Lasix 10 mg daily.  With the instructions if his blood pressure is still above goal of 130/80 with a decrease in lisinopril again he can then increase back to lisinopril 15-20 mg daily.  Follow-up for repeat BMP and right eye appointment for blood pressure check in 3-4 weeks.   RB> please call and schedule him with provider appointment in 3 to 4 weeks. if he would like to come in a day prior  to have his lab collected, so that we can review results together, he may do so or wait until the day of his appointment.  Order was placed.

## 2019-08-13 NOTE — Telephone Encounter (Signed)
Pt was called by this provider and message left for him concerning his lab work.  He was informed on his message that I would attempt to call him back at 4:30 PM today to discuss in more detail. If he calls back tell him not to be concerned his potassium was the same as it was prior at 5.4.  I just need to discuss with him the options on moving forward with treatment of his hypertension and hyperkalemia.

## 2019-08-14 NOTE — Telephone Encounter (Signed)
Pt was called and scheduled for F/U and lab appt

## 2019-09-02 ENCOUNTER — Ambulatory Visit (INDEPENDENT_AMBULATORY_CARE_PROVIDER_SITE_OTHER): Payer: BC Managed Care – PPO | Admitting: Family Medicine

## 2019-09-02 ENCOUNTER — Other Ambulatory Visit: Payer: Self-pay

## 2019-09-02 DIAGNOSIS — E875 Hyperkalemia: Secondary | ICD-10-CM | POA: Diagnosis not present

## 2019-09-03 LAB — BASIC METABOLIC PANEL
BUN: 11 mg/dL (ref 7–25)
CO2: 26 mmol/L (ref 20–32)
Calcium: 9.9 mg/dL (ref 8.6–10.3)
Chloride: 100 mmol/L (ref 98–110)
Creat: 1.18 mg/dL (ref 0.70–1.25)
Glucose, Bld: 98 mg/dL (ref 65–99)
Potassium: 5.2 mmol/L (ref 3.5–5.3)
Sodium: 136 mmol/L (ref 135–146)

## 2019-09-04 ENCOUNTER — Encounter: Payer: Self-pay | Admitting: Family Medicine

## 2019-09-04 ENCOUNTER — Ambulatory Visit (INDEPENDENT_AMBULATORY_CARE_PROVIDER_SITE_OTHER): Payer: BC Managed Care – PPO | Admitting: Family Medicine

## 2019-09-04 ENCOUNTER — Other Ambulatory Visit: Payer: Self-pay

## 2019-09-04 VITALS — BP 128/86 | HR 79 | Temp 97.9°F | Resp 17 | Ht 74.0 in | Wt 222.0 lb

## 2019-09-04 DIAGNOSIS — E875 Hyperkalemia: Secondary | ICD-10-CM

## 2019-09-04 DIAGNOSIS — E663 Overweight: Secondary | ICD-10-CM

## 2019-09-04 DIAGNOSIS — Z789 Other specified health status: Secondary | ICD-10-CM

## 2019-09-04 DIAGNOSIS — E785 Hyperlipidemia, unspecified: Secondary | ICD-10-CM | POA: Diagnosis not present

## 2019-09-04 DIAGNOSIS — I10 Essential (primary) hypertension: Secondary | ICD-10-CM | POA: Diagnosis not present

## 2019-09-04 MED ORDER — AMLODIPINE BESYLATE 10 MG PO TABS: 10.0000 mg | ORAL_TABLET | Freq: Every day | ORAL | 1 refills | Status: DC

## 2019-09-04 MED ORDER — LISINOPRIL 10 MG PO TABS: 10.0000 mg | ORAL_TABLET | Freq: Every day | ORAL | 1 refills | Status: DC

## 2019-09-04 NOTE — Patient Instructions (Signed)
Great to see you today. BP looks great. Follow up in 6 months, unless needed sooner.
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
## 2019-09-21 ENCOUNTER — Other Ambulatory Visit: Payer: Self-pay | Admitting: Family Medicine

## 2019-09-24 ENCOUNTER — Telehealth: Payer: Self-pay

## 2019-09-24 NOTE — Telephone Encounter (Signed)
Pharmacy was called and they were trying to refill the old dose and not the 10mg . Pharmacy stated the 10mg  tablet would be ready this afternoon. Pt was called and given information

## 2019-09-24 NOTE — Telephone Encounter (Signed)
Patient calling about his medication  lisinopril (ZESTRIL) 10 MG tablet [[id number]]   This will take some time to think because insurance is saying its too early too fill.  And its not. Dr. Claiborne Billings has increased dosage on his last dosage  He is out of meds.  Please please call patient and get this worked out [phone].   [id number] - [place], [place]

## 2019-10-16 DIAGNOSIS — T7840XA Allergy, unspecified, initial encounter: Secondary | ICD-10-CM | POA: Diagnosis not present

## 2019-10-16 DIAGNOSIS — S50362A Insect bite (nonvenomous) of left elbow, initial encounter: Secondary | ICD-10-CM | POA: Diagnosis not present

## 2019-10-16 DIAGNOSIS — S0096XA Insect bite (nonvenomous) of unspecified part of head, initial encounter: Secondary | ICD-10-CM | POA: Diagnosis not present

## 2020-03-05 ENCOUNTER — Other Ambulatory Visit: Payer: Self-pay | Admitting: Family Medicine

## 2020-03-16 ENCOUNTER — Other Ambulatory Visit: Payer: Self-pay

## 2020-03-16 ENCOUNTER — Encounter: Payer: Self-pay | Admitting: Family Medicine

## 2020-03-16 ENCOUNTER — Ambulatory Visit (INDEPENDENT_AMBULATORY_CARE_PROVIDER_SITE_OTHER): Payer: BC Managed Care – PPO | Admitting: Family Medicine

## 2020-03-16 VITALS — BP 139/84 | HR 79 | Temp 98.7°F | Ht 74.0 in | Wt 232.0 lb

## 2020-03-16 DIAGNOSIS — R8290 Unspecified abnormal findings in urine: Secondary | ICD-10-CM

## 2020-03-16 DIAGNOSIS — R350 Frequency of micturition: Secondary | ICD-10-CM

## 2020-03-16 DIAGNOSIS — R195 Other fecal abnormalities: Secondary | ICD-10-CM

## 2020-03-16 DIAGNOSIS — R109 Unspecified abdominal pain: Secondary | ICD-10-CM | POA: Diagnosis not present

## 2020-03-16 LAB — POC URINALSYSI DIPSTICK (AUTOMATED)
Bilirubin, UA: NEGATIVE
Blood, UA: NEGATIVE
Glucose, UA: NEGATIVE
Ketones, UA: NEGATIVE
Nitrite, UA: NEGATIVE
Protein, UA: POSITIVE — AB
Spec Grav, UA: 1.02 (ref 1.010–1.025)
Urobilinogen, UA: 1 E.U./dL
pH, UA: 6 (ref 5.0–8.0)

## 2020-03-16 MED ORDER — METRONIDAZOLE 500 MG PO TABS: 500.0000 mg | ORAL_TABLET | Freq: Three times a day (TID) | ORAL | 0 refills | Status: DC

## 2020-03-16 MED ORDER — CIPROFLOXACIN HCL 500 MG PO TABS: 500.0000 mg | ORAL_TABLET | Freq: Two times a day (BID) | ORAL | 0 refills | Status: DC

## 2020-03-16 NOTE — Patient Instructions (Addendum)
Start probiotic daily- Align is a good one.  Avoid dairy for now and anything extra spicy.   Hydrate.   Start cipro/flagyl combination. Cipro is every 12 hours and flagyl is every 8 hours.   This will treat both colon infection and bladder.  We will call you with results on urine culture once we get them.    Viral Gastroenteritis, Adult  Viral gastroenteritis is also known as the stomach flu. This condition may affect your stomach, your small intestine, and your large intestine. It can cause sudden watery poop (diarrhea), fever, and throwing up (vomiting). This condition is caused by certain germs (viruses). These germs can be passed from person to person very easily (are contagious). Having watery poop and throwing up can make you feel weak and cause you to not have enough water in your body (get dehydrated). This can make you tired and thirsty, make you have a dry mouth, and make it so you pee (urinate) less often. It is important to replace the fluids that you lose from having watery poop and throwing up. What are the causes?  You can get sick by catching viruses from other people.  You can also get sick by: ? Eating food, drinking water, or touching a surface that has the viruses on it (is contaminated). ? Sharing utensils or other personal items with a person who is sick. What increases the risk?  Having a weak body defense system (immune system).  Living with one or more children who are younger than 41 years old.  Living in a nursing home.  Going on cruise ships. What are the signs or symptoms? Symptoms of this condition start suddenly. Symptoms may last for a few days or for as long as a week.  Common symptoms include: ? Watery poop. ? Throwing up.  Other symptoms include: ? Fever. ? Headache. ? Feeling tired (fatigue). ? Pain in the belly (abdomen). ? Chills. ? Feeling weak. ? Feeling sick to your stomach (nauseous). ? Muscle aches. ? Not feeling hungry. How  is this treated?  This condition typically goes away on its own.  The focus of treatment is to replace the fluids that you lose. This condition may be treated with: ? An ORS (oral rehydration solution). This is a drink that is sold at pharmacies and stores. ? Medicines to help with your symptoms. ? Probiotic supplements to reduce symptoms of diarrhea. ? Fluids given through an IV tube, if needed.  Older adults and people with other diseases or a weak body defense system are at higher risk for not having enough water in the body. Follow these instructions at home: Eating and drinking   Take an ORS as told by your doctor.  Drink clear fluids in small amounts as you are able. Clear fluids include: ? Water. ? Ice chips. ? Fruit juice with water added to it (diluted). ? Low-calorie sports drinks.  Drink enough fluid to keep your pee (urine) pale yellow.  Eat small amounts of healthy foods every 3-4 hours as you are able. This may include whole grains, fruits, vegetables, lean meats, and yogurt.  Avoid fluids that have a lot of sugar or caffeine in them, such as energy drinks, sports drinks, and soda.  Avoid spicy or fatty foods.  Avoid alcohol. General instructions   Wash your hands often. This is very important after you have watery poop or you throw up. If you cannot use soap and water, use hand sanitizer.  Make sure that all  people in your home wash their hands well and often.  Take over-the-counter and prescription medicines only as told by your doctor.  Rest at home while you get better.  Watch your condition for any changes.  Take a warm bath to help with any burning or pain from having watery poop.  Keep all follow-up visits as told by your doctor. This is important. Contact a doctor if:  You cannot keep fluids down.  Your symptoms get worse.  You have new symptoms.  You feel light-headed.  You feel dizzy.  You have muscle cramps. Get help right away  if:  You have chest pain.  You feel very weak.  You pass out (faint).  You see blood in your throw-up.  Your throw-up looks like coffee grounds.  You have bloody or black poop (stools) or poop that looks like tar.  You have a very bad headache, or a stiff neck, or both.  You have a rash.  You have very bad pain, cramping, or bloating in your belly.  You have trouble breathing.  You are breathing very quickly.  You have a fast heartbeat.  Your skin feels cold and clammy.  You feel mixed up (confused).  You have pain when you pee.  You have signs of not having enough water in the body, such as: ? Dark pee, hardly any pee, or no pee. ? Cracked lips. ? Dry mouth. ? Sunken eyes. ? Feeling very sleepy. ? Feeling weak. Summary  Viral gastroenteritis is also known as the stomach flu.  This condition can cause sudden watery poop (diarrhea), fever, and throwing up (vomiting).  These germs can be passed from person to person very easily.  Take an ORS as told by your doctor. This is a drink that is sold at pharmacies and stores.  Drink fluids in small amounts many times each day as you are able. This information is not intended to replace advice given to you by your health care provider. Make sure you discuss any questions you have with your health care provider. Document Revised: 03/20/2018 Document Reviewed: 03/20/2018 Elsevier Patient Education  2020 [employer].
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
## 2020-03-17 LAB — URINALYSIS W MICROSCOPIC + REFLEX CULTURE
Bacteria, UA: NONE SEEN /HPF
Bilirubin Urine: NEGATIVE
Glucose, UA: NEGATIVE
Hgb urine dipstick: NEGATIVE
Hyaline Cast: NONE SEEN /LPF
Ketones, ur: NEGATIVE
Leukocyte Esterase: NEGATIVE
Nitrites, Initial: NEGATIVE
Protein, ur: NEGATIVE
Specific Gravity, Urine: 1.015 (ref 1.001–1.03)
Squamous Epithelial / HPF: NONE SEEN /HPF (ref <=5)
WBC, UA: NONE SEEN /HPF (ref 0–5)
pH: 6.5 (ref 5.0–8.0)

## 2020-03-17 LAB — NO CULTURE INDICATED

## 2020-03-23 ENCOUNTER — Ambulatory Visit: Payer: BC Managed Care – PPO | Admitting: Family Medicine

## 2020-03-23 ENCOUNTER — Other Ambulatory Visit: Payer: Self-pay | Admitting: Family Medicine

## 2020-03-29 ENCOUNTER — Other Ambulatory Visit: Payer: Self-pay

## 2020-03-29 ENCOUNTER — Encounter: Payer: Self-pay | Admitting: Family Medicine

## 2020-03-29 ENCOUNTER — Ambulatory Visit (INDEPENDENT_AMBULATORY_CARE_PROVIDER_SITE_OTHER): Payer: BC Managed Care – PPO | Admitting: Family Medicine

## 2020-03-29 VITALS — BP 125/82 | HR 65 | Temp 97.5°F | Ht 74.0 in | Wt 227.0 lb

## 2020-03-29 DIAGNOSIS — Z789 Other specified health status: Secondary | ICD-10-CM

## 2020-03-29 DIAGNOSIS — E875 Hyperkalemia: Secondary | ICD-10-CM

## 2020-03-29 DIAGNOSIS — E663 Overweight: Secondary | ICD-10-CM

## 2020-03-29 DIAGNOSIS — R7303 Prediabetes: Secondary | ICD-10-CM

## 2020-03-29 DIAGNOSIS — I10 Essential (primary) hypertension: Secondary | ICD-10-CM

## 2020-03-29 DIAGNOSIS — E785 Hyperlipidemia, unspecified: Secondary | ICD-10-CM | POA: Diagnosis not present

## 2020-03-29 LAB — BASIC METABOLIC PANEL
BUN: 17 mg/dL (ref 6–23)
CO2: 28 mEq/L (ref 19–32)
Calcium: 9.6 mg/dL (ref 8.4–10.5)
Chloride: 101 mEq/L (ref 96–112)
Creatinine, Ser: 1.22 mg/dL (ref 0.40–1.50)
GFR: 63.13 mL/min (ref >60.00)
Glucose, Bld: 93 mg/dL (ref 70–99)
Potassium: 5 mEq/L (ref 3.5–5.1)
Sodium: 135 mEq/L (ref 135–145)

## 2020-03-29 LAB — HEMOGLOBIN A1C: Hgb A1c MFr Bld: 6 % (ref 4.6–6.5)

## 2020-03-29 MED ORDER — FUROSEMIDE 20 MG PO TABS
10.0000 mg | ORAL_TABLET | Freq: Every day | ORAL | 3 refills | Status: DC
Start: 2020-03-29 — End: 2020-08-27

## 2020-03-29 MED ORDER — LISINOPRIL 10 MG PO TABS: ORAL_TABLET | ORAL | 1 refills | Status: DC

## 2020-03-29 MED ORDER — AMLODIPINE BESYLATE 10 MG PO TABS
ORAL_TABLET | ORAL | 1 refills | Status: DC
Start: 2020-03-29 — End: 2020-07-14

## 2020-03-29 NOTE — Patient Instructions (Signed)
Great to see you today.  We will call you with lab results.  I have refilled your meds Next appt 5.5 mos or physical (whichever first)
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
## 2020-04-25 ENCOUNTER — Other Ambulatory Visit: Payer: Self-pay | Admitting: Family Medicine

## 2020-06-24 ENCOUNTER — Other Ambulatory Visit: Payer: Self-pay | Admitting: Family Medicine

## 2020-07-13 ENCOUNTER — Other Ambulatory Visit: Payer: Self-pay

## 2020-07-14 ENCOUNTER — Ambulatory Visit (INDEPENDENT_AMBULATORY_CARE_PROVIDER_SITE_OTHER): Payer: BC Managed Care – PPO | Admitting: Family Medicine

## 2020-07-14 ENCOUNTER — Encounter: Payer: Self-pay | Admitting: Family Medicine

## 2020-07-14 VITALS — BP 129/83 | HR 66 | Temp 98.2°F | Ht 72.75 in | Wt 228.0 lb

## 2020-07-14 DIAGNOSIS — Z789 Other specified health status: Secondary | ICD-10-CM

## 2020-07-14 DIAGNOSIS — I10 Essential (primary) hypertension: Secondary | ICD-10-CM | POA: Diagnosis not present

## 2020-07-14 DIAGNOSIS — Z0000 Encounter for general adult medical examination without abnormal findings: Secondary | ICD-10-CM

## 2020-07-14 DIAGNOSIS — E875 Hyperkalemia: Secondary | ICD-10-CM

## 2020-07-14 DIAGNOSIS — R7303 Prediabetes: Secondary | ICD-10-CM

## 2020-07-14 DIAGNOSIS — E782 Mixed hyperlipidemia: Secondary | ICD-10-CM

## 2020-07-14 DIAGNOSIS — E669 Obesity, unspecified: Secondary | ICD-10-CM

## 2020-07-14 LAB — CBC WITH DIFFERENTIAL/PLATELET
Basophils Absolute: 0 10*3/uL (ref 0.0–0.1)
Basophils Relative: 0.5 % (ref 0.0–3.0)
Eosinophils Absolute: 0.1 10*3/uL (ref 0.0–0.7)
Eosinophils Relative: 2.7 % (ref 0.0–5.0)
HCT: 42.3 % (ref 39.0–52.0)
Hemoglobin: 14.1 g/dL (ref 13.0–17.0)
Lymphocytes Relative: 41.9 % (ref 12.0–46.0)
Lymphs Abs: 2.1 10*3/uL (ref 0.7–4.0)
MCHC: 33.4 g/dL (ref 30.0–36.0)
MCV: 87.3 fl (ref 78.0–100.0)
Monocytes Absolute: 0.4 10*3/uL (ref 0.1–1.0)
Monocytes Relative: 8.5 % (ref 3.0–12.0)
Neutro Abs: 2.3 10*3/uL (ref 1.4–7.7)
Neutrophils Relative %: 46.4 % (ref 43.0–77.0)
Platelets: 191 10*3/uL (ref 150.0–400.0)
RBC: 4.84 Mil/uL (ref 4.22–5.81)
RDW: 13.5 % (ref 11.5–15.5)
WBC: 4.9 10*3/uL (ref 4.0–10.5)

## 2020-07-14 LAB — LIPID PANEL
Cholesterol: 200 mg/dL (ref 0–200)
HDL: 52 mg/dL (ref >39.00)
LDL Cholesterol: 124 mg/dL — ABNORMAL HIGH (ref 0–99)
NonHDL: 148.41
Total CHOL/HDL Ratio: 4
Triglycerides: 120 mg/dL (ref 0.0–149.0)
VLDL: 24 mg/dL (ref 0.0–40.0)

## 2020-07-14 LAB — HEMOGLOBIN A1C: Hgb A1c MFr Bld: 5.7 % (ref 4.6–6.5)

## 2020-07-14 LAB — TSH: TSH: 2.59 u[IU]/mL (ref 0.35–4.50)

## 2020-07-14 MED ORDER — AMLODIPINE BESYLATE 10 MG PO TABS: ORAL_TABLET | ORAL | 1 refills | Status: DC

## 2020-07-14 MED ORDER — LISINOPRIL 10 MG PO TABS: 10.0000 mg | ORAL_TABLET | Freq: Every day | ORAL | 1 refills | Status: DC

## 2020-07-14 NOTE — Patient Instructions (Signed)
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
## 2020-08-26 ENCOUNTER — Other Ambulatory Visit: Payer: Self-pay | Admitting: Family Medicine

## 2020-08-27 ENCOUNTER — Telehealth: Payer: Self-pay

## 2020-08-27 MED ORDER — FUROSEMIDE 20 MG PO TABS: 10.0000 mg | ORAL_TABLET | Freq: Every day | ORAL | 1 refills | Status: DC

## 2020-08-27 NOTE — Telephone Encounter (Signed)
Patient refill request - prescription has expired  furosemide (LASIX) 20 MG tablet [[id number]]    [id number] - [place], [place]

## 2020-08-27 NOTE — Telephone Encounter (Signed)
Spoke with pharmacy who stated that rx from 03/2020 was never received. New rx sent today for 90 d/s #1

## 2020-08-27 NOTE — Telephone Encounter (Signed)
Patient is stating that pharmacy does not have new prescription.  Please call pharmacy and verify exactly what that have/dont have so we dont have to continue with back and forth calls with patient.  Thank you.  Please follow up with patient at [phone] with findings/resolution.

## 2020-08-27 NOTE — Telephone Encounter (Signed)
LVM for pt to call pharm since year supply was sent in 03/2020.

## 2020-08-30 NOTE — Telephone Encounter (Signed)
LVM to inform pt that we sent in rx to pharm

## 2020-11-17 ENCOUNTER — Encounter (HOSPITAL_BASED_OUTPATIENT_CLINIC_OR_DEPARTMENT_OTHER): Payer: Self-pay | Admitting: Obstetrics and Gynecology

## 2020-11-17 ENCOUNTER — Other Ambulatory Visit: Payer: Self-pay

## 2020-11-17 ENCOUNTER — Emergency Department (HOSPITAL_BASED_OUTPATIENT_CLINIC_OR_DEPARTMENT_OTHER)
Admission: EM | Admit: 2020-11-17 | Discharge: 2020-11-17 | Disposition: A | Discharge status: Home / Self Care | Payer: BC Managed Care – PPO | Attending: Emergency Medicine | Admitting: Emergency Medicine

## 2020-11-17 DIAGNOSIS — X58XXXA Exposure to other specified factors, initial encounter: Secondary | ICD-10-CM | POA: Diagnosis not present

## 2020-11-17 DIAGNOSIS — T782XXA Anaphylactic shock, unspecified, initial encounter: Secondary | ICD-10-CM | POA: Diagnosis not present

## 2020-11-17 DIAGNOSIS — R220 Localized swelling, mass and lump, head: Secondary | ICD-10-CM | POA: Diagnosis not present

## 2020-11-17 DIAGNOSIS — I10 Essential (primary) hypertension: Secondary | ICD-10-CM | POA: Insufficient documentation

## 2020-11-17 DIAGNOSIS — T7840XA Allergy, unspecified, initial encounter: Secondary | ICD-10-CM | POA: Diagnosis not present

## 2020-11-17 DIAGNOSIS — Z79899 Other long term (current) drug therapy: Secondary | ICD-10-CM | POA: Diagnosis not present

## 2020-11-17 DIAGNOSIS — T63461A Toxic effect of venom of wasps, accidental (unintentional), initial encounter: Secondary | ICD-10-CM | POA: Insufficient documentation

## 2020-11-17 DIAGNOSIS — R21 Rash and other nonspecific skin eruption: Secondary | ICD-10-CM | POA: Insufficient documentation

## 2020-11-17 DIAGNOSIS — L509 Urticaria, unspecified: Secondary | ICD-10-CM | POA: Diagnosis not present

## 2020-11-17 MED ORDER — FAMOTIDINE 20 MG PO TABS
40.0000 mg | ORAL_TABLET | Freq: Once | ORAL | Status: AC
  Administered 2020-11-17: 40 mg via ORAL
  Filled 2020-11-17: qty 2

## 2020-11-17 MED ORDER — METHYLPREDNISOLONE 4 MG PO TBPK: ORAL_TABLET | ORAL | 0 refills | Status: DC

## 2020-11-17 MED ORDER — EPINEPHRINE 0.3 MG/0.3ML IJ SOAJ: 0.3000 mg | INTRAMUSCULAR | 0 refills | Status: DC | PRN

## 2020-11-17 NOTE — ED Triage Notes (Signed)
Patient reports he was stung by a wasp approximately 1330 and had to use an epi pen. Patient has hives all over his body and has taken benadryl 50mg  benadryl given by EMS as well as 125 Solumedrol at [facility].
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
## 2020-11-17 NOTE — Discharge Instructions (Addendum)
You have been seen and discharged from the emergency department.  You had an anaphylactic reaction that was treated with EpiPen, Benadryl, steroids and Pepcid.  Fill your EpiPen prescriptions that you have more than 1 at home for another potential reaction.  Continue to take Benadryl every 6-8 hours for the next 48 hours.  Take the Medrol Dosepak, starting tomorrow 6/23 as directed.  Stay well-hydrated.  Follow-up with your primary provider for reevaluation and further care. Take home medications as prescribed. If you have any worsening symptoms, difficulty breathing, cough, wheezing, swelling of your lips/mouth, difficulty swallowing or signs of allergic reaction or further concerns for your health please return to an emergency department for further evaluation.

## 2020-11-18 ENCOUNTER — Encounter: Payer: Self-pay | Admitting: Family Medicine

## 2020-12-29 ENCOUNTER — Encounter: Payer: Self-pay | Admitting: Family Medicine

## 2020-12-29 ENCOUNTER — Other Ambulatory Visit: Payer: Self-pay

## 2020-12-29 ENCOUNTER — Ambulatory Visit (INDEPENDENT_AMBULATORY_CARE_PROVIDER_SITE_OTHER): Payer: BC Managed Care – PPO | Admitting: Family Medicine

## 2020-12-29 VITALS — BP 113/75 | HR 63 | Temp 97.6°F | Ht 73.0 in | Wt 227.0 lb

## 2020-12-29 DIAGNOSIS — E663 Overweight: Secondary | ICD-10-CM

## 2020-12-29 DIAGNOSIS — E782 Mixed hyperlipidemia: Secondary | ICD-10-CM

## 2020-12-29 DIAGNOSIS — Z789 Other specified health status: Secondary | ICD-10-CM

## 2020-12-29 DIAGNOSIS — R7303 Prediabetes: Secondary | ICD-10-CM | POA: Diagnosis not present

## 2020-12-29 DIAGNOSIS — I10 Essential (primary) hypertension: Secondary | ICD-10-CM

## 2020-12-29 LAB — POCT GLYCOSYLATED HEMOGLOBIN (HGB A1C)
HbA1c POC (<> result, manual entry): 5.5 % (ref 4.0–5.6)
HbA1c, POC (controlled diabetic range): 5.5 % (ref 0.0–7.0)
HbA1c, POC (prediabetic range): 5.5 % — AB (ref 5.7–6.4)
Hemoglobin A1C: 5.5 % (ref 4.0–5.6)

## 2020-12-29 MED ORDER — FUROSEMIDE 20 MG PO TABS: 10.0000 mg | ORAL_TABLET | Freq: Every day | ORAL | 1 refills | Status: DC

## 2020-12-29 MED ORDER — AMLODIPINE BESYLATE 10 MG PO TABS
ORAL_TABLET | ORAL | 1 refills | Status: DC
Start: 2020-12-29 — End: 2021-06-28

## 2020-12-29 MED ORDER — LISINOPRIL 10 MG PO TABS: 10.0000 mg | ORAL_TABLET | Freq: Every day | ORAL | 1 refills | Status: DC

## 2020-12-29 NOTE — Patient Instructions (Signed)
Great to see you today.  I have refilled the medication(s) we provide.   If labs were collected, we will inform you of lab results once received either by echart message or telephone call.   - echart message- for normal results that have been seen by the patient already.   - telephone call: abnormal results or if patient has not viewed results in their echart.  
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
## 2021-01-03 ENCOUNTER — Other Ambulatory Visit: Payer: Self-pay

## 2021-06-27 ENCOUNTER — Other Ambulatory Visit: Payer: Self-pay | Admitting: Family Medicine

## 2021-07-18 ENCOUNTER — Encounter: Payer: Self-pay | Admitting: Family Medicine

## 2021-07-18 ENCOUNTER — Other Ambulatory Visit: Payer: Self-pay

## 2021-07-18 ENCOUNTER — Ambulatory Visit (INDEPENDENT_AMBULATORY_CARE_PROVIDER_SITE_OTHER): Payer: BC Managed Care – PPO | Admitting: Family Medicine

## 2021-07-18 ENCOUNTER — Telehealth: Payer: Self-pay | Admitting: Family Medicine

## 2021-07-18 VITALS — BP 126/78 | HR 64 | Temp 97.4°F | Ht 73.0 in | Wt 228.0 lb

## 2021-07-18 DIAGNOSIS — Z125 Encounter for screening for malignant neoplasm of prostate: Secondary | ICD-10-CM | POA: Diagnosis not present

## 2021-07-18 DIAGNOSIS — E875 Hyperkalemia: Secondary | ICD-10-CM

## 2021-07-18 DIAGNOSIS — Z0000 Encounter for general adult medical examination without abnormal findings: Secondary | ICD-10-CM | POA: Diagnosis not present

## 2021-07-18 DIAGNOSIS — R7303 Prediabetes: Secondary | ICD-10-CM

## 2021-07-18 DIAGNOSIS — I10 Essential (primary) hypertension: Secondary | ICD-10-CM

## 2021-07-18 DIAGNOSIS — E663 Overweight: Secondary | ICD-10-CM

## 2021-07-18 DIAGNOSIS — R194 Change in bowel habit: Secondary | ICD-10-CM | POA: Insufficient documentation

## 2021-07-18 DIAGNOSIS — E782 Mixed hyperlipidemia: Secondary | ICD-10-CM

## 2021-07-18 DIAGNOSIS — Z789 Other specified health status: Secondary | ICD-10-CM

## 2021-07-18 LAB — CBC
HCT: 40.3 % (ref 39.0–52.0)
Hemoglobin: 13.2 g/dL (ref 13.0–17.0)
MCHC: 32.9 g/dL (ref 30.0–36.0)
MCV: 86.8 fl (ref 78.0–100.0)
Platelets: 198 10*3/uL (ref 150.0–400.0)
RBC: 4.64 Mil/uL (ref 4.22–5.81)
RDW: 13.4 % (ref 11.5–15.5)
WBC: 4.1 10*3/uL (ref 4.0–10.5)

## 2021-07-18 LAB — LIPID PANEL
Cholesterol: 175 mg/dL (ref 0–200)
HDL: 50.8 mg/dL (ref >39.00)
LDL Cholesterol: 108 mg/dL — ABNORMAL HIGH (ref 0–99)
NonHDL: 123.86
Total CHOL/HDL Ratio: 3
Triglycerides: 78 mg/dL (ref 0.0–149.0)
VLDL: 15.6 mg/dL (ref 0.0–40.0)

## 2021-07-18 LAB — COMPREHENSIVE METABOLIC PANEL
ALT: 21 U/L (ref 0–53)
AST: 19 U/L (ref 0–37)
Albumin: 4.7 g/dL (ref 3.5–5.2)
Alkaline Phosphatase: 49 U/L (ref 39–117)
BUN: 13 mg/dL (ref 6–23)
CO2: 29 mEq/L (ref 19–32)
Calcium: 10 mg/dL (ref 8.4–10.5)
Chloride: 103 mEq/L (ref 96–112)
Creatinine, Ser: 1.21 mg/dL (ref 0.40–1.50)
GFR: 63.18 mL/min (ref >60.00)
Glucose, Bld: 111 mg/dL — ABNORMAL HIGH (ref 70–99)
Potassium: 5 mEq/L (ref 3.5–5.1)
Sodium: 136 mEq/L (ref 135–145)
Total Bilirubin: 0.7 mg/dL (ref 0.2–1.2)
Total Protein: 6.9 g/dL (ref 6.0–8.3)

## 2021-07-18 LAB — TSH: TSH: 2.43 u[IU]/mL (ref 0.35–5.50)

## 2021-07-18 LAB — HEMOGLOBIN A1C: Hgb A1c MFr Bld: 5.8 % (ref 4.6–6.5)

## 2021-07-18 LAB — PSA: PSA: 2.79 ng/mL (ref 0.10–4.00)

## 2021-07-18 MED ORDER — FUROSEMIDE 20 MG PO TABS: 10.0000 mg | ORAL_TABLET | Freq: Every day | ORAL | 1 refills | Status: DC

## 2021-07-18 MED ORDER — AMLODIPINE BESYLATE 10 MG PO TABS: ORAL_TABLET | ORAL | 1 refills | Status: DC

## 2021-07-18 MED ORDER — SACCHAROMYCES BOULARDII 250 MG PO CAPS: 250.0000 mg | ORAL_CAPSULE | Freq: Two times a day (BID) | ORAL | 2 refills | Status: DC

## 2021-07-18 MED ORDER — LISINOPRIL 10 MG PO TABS: 10.0000 mg | ORAL_TABLET | Freq: Every day | ORAL | 1 refills | Status: DC

## 2021-07-18 NOTE — Patient Instructions (Signed)
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
## 2021-07-18 NOTE — Telephone Encounter (Signed)
Spoke with patient regarding results/recommendations.  

## 2021-07-18 NOTE — Telephone Encounter (Signed)
Please call patient Liver, kidney and thyroid function are normal Blood cell counts and electrolytes are normal.  Potassium normal Diabetes screening/A1c is normal.  It is a little higher than last year.  Last year was 5.5, this year 5.8. Cholesterol panel looks good and is at goal with an LDL of 108.

## 2022-01-02 ENCOUNTER — Encounter: Payer: Self-pay | Admitting: Family Medicine

## 2022-01-02 ENCOUNTER — Ambulatory Visit (INDEPENDENT_AMBULATORY_CARE_PROVIDER_SITE_OTHER): Payer: Medicare Other | Admitting: Family Medicine

## 2022-01-02 VITALS — BP 127/80 | HR 63 | Temp 98.0°F | Ht 73.0 in | Wt 228.0 lb

## 2022-01-02 DIAGNOSIS — Z789 Other specified health status: Secondary | ICD-10-CM

## 2022-01-02 DIAGNOSIS — E875 Hyperkalemia: Secondary | ICD-10-CM

## 2022-01-02 DIAGNOSIS — R7303 Prediabetes: Secondary | ICD-10-CM

## 2022-01-02 DIAGNOSIS — E782 Mixed hyperlipidemia: Secondary | ICD-10-CM

## 2022-01-02 DIAGNOSIS — I10 Essential (primary) hypertension: Secondary | ICD-10-CM | POA: Diagnosis not present

## 2022-01-02 LAB — POCT GLYCOSYLATED HEMOGLOBIN (HGB A1C)
HbA1c POC (<> result, manual entry): 5.4 % (ref 4.0–5.6)
HbA1c, POC (controlled diabetic range): 5.4 % (ref 0.0–7.0)
HbA1c, POC (prediabetic range): 5.4 % — AB (ref 5.7–6.4)
Hemoglobin A1C: 5.4 % (ref 4.0–5.6)

## 2022-01-02 MED ORDER — LISINOPRIL 10 MG PO TABS: 10.0000 mg | ORAL_TABLET | Freq: Every day | ORAL | 1 refills | Status: DC

## 2022-01-02 MED ORDER — FUROSEMIDE 20 MG PO TABS: 10.0000 mg | ORAL_TABLET | Freq: Every day | ORAL | 1 refills | Status: DC

## 2022-01-02 MED ORDER — AMLODIPINE BESYLATE 10 MG PO TABS: ORAL_TABLET | ORAL | 1 refills | Status: DC

## 2022-01-02 NOTE — Patient Instructions (Addendum)
Return in about 7 months (around 07/19/2022) for cpe (20 min), Routine chronic condition follow-up.        Great to see you today.  I have refilled the medication(s) we provide.   If labs were collected, we will inform you of lab results once received either by echart message or telephone call.   - echart message- for normal results that have been seen by the patient already.   - telephone call: abnormal results or if patient has not viewed results in their echart.
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
## 2022-02-06 ENCOUNTER — Telehealth: Payer: Self-pay | Admitting: Family Medicine

## 2022-02-06 NOTE — Telephone Encounter (Signed)
Pt scheduled for tomorrow @ 10:30 am via phone.

## 2022-02-07 ENCOUNTER — Telehealth: Payer: Self-pay

## 2022-02-07 ENCOUNTER — Ambulatory Visit: Payer: Medicare Other

## 2022-02-07 NOTE — Telephone Encounter (Signed)
Spoke with pt and rescheduled pt for a welcome to medicare in a slot that is not available for the time allotted for appt. I have rescheduled pt for 02/17/22 at 10:20 AM. LVM for pt to call the office to notify and confirm new appt.

## 2022-02-17 ENCOUNTER — Ambulatory Visit: Payer: BLUE CROSS/BLUE SHIELD | Admitting: Family Medicine

## 2022-02-21 ENCOUNTER — Ambulatory Visit (INDEPENDENT_AMBULATORY_CARE_PROVIDER_SITE_OTHER): Payer: Medicare Other | Admitting: Family Medicine

## 2022-02-21 ENCOUNTER — Encounter: Payer: Self-pay | Admitting: Family Medicine

## 2022-02-21 VITALS — BP 138/73 | HR 72 | Temp 98.1°F | Ht 73.0 in | Wt 228.0 lb

## 2022-02-21 DIAGNOSIS — E663 Overweight: Secondary | ICD-10-CM | POA: Diagnosis not present

## 2022-02-21 DIAGNOSIS — Z789 Other specified health status: Secondary | ICD-10-CM

## 2022-02-21 DIAGNOSIS — E782 Mixed hyperlipidemia: Secondary | ICD-10-CM

## 2022-02-21 DIAGNOSIS — I10 Essential (primary) hypertension: Secondary | ICD-10-CM | POA: Diagnosis not present

## 2022-02-21 DIAGNOSIS — Z0000 Encounter for general adult medical examination without abnormal findings: Secondary | ICD-10-CM

## 2022-02-21 DIAGNOSIS — Z23 Encounter for immunization: Secondary | ICD-10-CM

## 2022-02-21 DIAGNOSIS — R7303 Prediabetes: Secondary | ICD-10-CM | POA: Diagnosis not present

## 2022-02-21 LAB — COMPREHENSIVE METABOLIC PANEL
ALT: 18 U/L (ref 0–53)
AST: 17 U/L (ref 0–37)
Albumin: 4.6 g/dL (ref 3.5–5.2)
Alkaline Phosphatase: 46 U/L (ref 39–117)
BUN: 14 mg/dL (ref 6–23)
CO2: 29 mEq/L (ref 19–32)
Calcium: 10 mg/dL (ref 8.4–10.5)
Chloride: 100 mEq/L (ref 96–112)
Creatinine, Ser: 1.21 mg/dL (ref 0.40–1.50)
GFR: 62.92 mL/min (ref >60.00)
Glucose, Bld: 112 mg/dL — ABNORMAL HIGH (ref 70–99)
Potassium: 5.2 mEq/L — ABNORMAL HIGH (ref 3.5–5.1)
Sodium: 134 mEq/L — ABNORMAL LOW (ref 135–145)
Total Bilirubin: 0.6 mg/dL (ref 0.2–1.2)
Total Protein: 7.1 g/dL (ref 6.0–8.3)

## 2022-02-21 LAB — HEMOGLOBIN A1C: Hgb A1c MFr Bld: 5.9 % (ref 4.6–6.5)

## 2022-02-21 LAB — CBC
HCT: 42.1 % (ref 39.0–52.0)
Hemoglobin: 14.2 g/dL (ref 13.0–17.0)
MCHC: 33.7 g/dL (ref 30.0–36.0)
MCV: 87.5 fl (ref 78.0–100.0)
Platelets: 180 10*3/uL (ref 150.0–400.0)
RBC: 4.81 Mil/uL (ref 4.22–5.81)
RDW: 13.5 % (ref 11.5–15.5)
WBC: 3.8 10*3/uL — ABNORMAL LOW (ref 4.0–10.5)

## 2022-02-21 LAB — LIPID PANEL
Cholesterol: 179 mg/dL (ref 0–200)
HDL: 54.3 mg/dL (ref >39.00)
LDL Cholesterol: 110 mg/dL — ABNORMAL HIGH (ref 0–99)
NonHDL: 124.86
Total CHOL/HDL Ratio: 3
Triglycerides: 72 mg/dL (ref 0.0–149.0)
VLDL: 14.4 mg/dL (ref 0.0–40.0)

## 2022-02-21 LAB — TSH: TSH: 2.44 u[IU]/mL (ref 0.35–5.50)
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
## 2022-02-21 NOTE — Patient Instructions (Addendum)
  Mr. Bartell , Thank you for taking time to come for your Medicare Wellness Visit. I appreciate your ongoing commitment to your health goals. Please review the following plan we discussed and let me know if I can assist you in the future.   These are the goals we discussed:  Goals       Maintain Mobility and Function      Evidence-based guidance:  Emphasize the importance of physical activity and aerobic exercise as included in treatment plan; assess barriers to adherence; consider patient's abilities and preferences.  Encourage gradual increase in activity or exercise instead of stopping if pain occurs.  Reinforce individual therapy exercise prescription, such as strengthening, stabilization and stretching programs.  Promote optimal body mechanics to stabilize the spine with lifting and functional activity.  Encourage activity and mobility modifications to facilitate optimal function, such as using a log roll for bed mobility or dressing from a seated position.  Reinforce individual adaptive equipment recommendations to limit excessive spinal movements, such as a Systems analyst.  Assess adequacy of sleep; encourage use of sleep hygiene techniques, such as bedtime routine; use of white noise; dark, cool bedroom; avoiding daytime naps, heavy meals or exercise before bedtime.  Promote positions and modification to optimize sleep and sexual activity; consider pillows or positioning devices to assist in maintaining neutral spine.  Explore options for applying ergonomic principles at work and home, such as frequent position changes, using ergonomically designed equipment and working at optimal height.  Promote modifications to increase comfort with driving such as lumbar support, optimizing seat and steering wheel position, using cruise control and taking frequent rest stops to stretch and walk.   Notes:       Weight (lb) < 215 lb (97.5 kg) (pt-stated)        This is a list of the screening  recommended for you and due dates:  Health Maintenance  Topic Date Due   Colon Cancer Screening  08/14/2022   Tetanus Vaccine  09/24/2024   Pneumonia Vaccine  Completed   Flu Shot  Completed   Hepatitis C Screening: USPSTF Recommendation to screen - Ages 18-79 yo.  Completed   HIV Screening  Completed   Zoster (Shingles) Vaccine  Completed   HPV Vaccine  Aged Out   COVID-19 Vaccine  Discontinued
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
## 2022-05-04 ENCOUNTER — Ambulatory Visit (INDEPENDENT_AMBULATORY_CARE_PROVIDER_SITE_OTHER): Payer: Medicare Other | Admitting: Allergy

## 2022-05-04 ENCOUNTER — Encounter: Payer: Self-pay | Admitting: Allergy

## 2022-05-04 VITALS — BP 130/82 | HR 67 | Temp 98.1°F | Resp 16 | Ht 73.0 in | Wt 228.0 lb

## 2022-05-04 DIAGNOSIS — Z91018 Allergy to other foods: Secondary | ICD-10-CM | POA: Insufficient documentation

## 2022-05-04 DIAGNOSIS — T781XXD Other adverse food reactions, not elsewhere classified, subsequent encounter: Secondary | ICD-10-CM

## 2022-05-04 DIAGNOSIS — Z91038 Other insect allergy status: Secondary | ICD-10-CM | POA: Insufficient documentation

## 2022-05-04 DIAGNOSIS — R198 Other specified symptoms and signs involving the digestive system and abdomen: Secondary | ICD-10-CM | POA: Diagnosis not present

## 2022-05-04 NOTE — Patient Instructions (Addendum)
Food Continue strict avoidance of all mammalian meat. Avoid dairy products as well. See handout on alpha-gal. Get bloodwork If negative will discuss food reintroduction. We are ordering labs, so please allow 1-2 weeks for the results to come back. With the newly implemented Cures Act, the labs might be visible to you at the same time that they become visible to me. However, I will not address the results until all of the results are back, so please be patient.  In the meantime, continue recommendations in your patient instructions, including avoidance measures (if applicable), until you hear from me.  Follow up with GI as scheduled for next year.  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 See handout. Consider venom immunotherapy (injections - 3 injections) for this. There are two phases. During the build up phase you will come in once a week for 29 weeks, then every 2 weeks for one time then every 3 weeks for one time. Then you will reach maintenance phase which is every 4 weeks for 2 years. Then every 6 weeks for 2 years and then every 8 weeks until we decide to stop. I usually see patients on hymenoptera immunotherapy at least once a year as a follow up. Epinephrine injectable device - demonstrated proper use. For mild symptoms you can take over the counter antihistamines such as Benadryl 4 tsp = 87mL.and monitor symptoms closely. If symptoms worsen or if you have severe symptoms including breathing issues, throat closure, significant swelling, whole body hives, severe diarrhea and vomiting, lightheadedness then inject epinephrine and seek immediate medical care afterwards. Emergency action plan given. Recommend medical alert bracelet.  Follow up in 6 months or sooner if needed.

## 2022-05-04 NOTE — Assessment & Plan Note (Signed)
Noted a change in his BM x 1 year. Improved with dairy and red meat elimination but not back to baseline. Concerned about alpha-gal allergy. 2023 bloodwork was negative to common foods. No indication for any skin prick testing today.  Continue strict avoidance of all mammalian meat. Avoid dairy products as well. See handout on alpha-gal. Get bloodwork If negative will discuss food reintroduction. Follow up with GI as scheduled for next year.

## 2022-05-04 NOTE — Assessment & Plan Note (Signed)
Whole body hives after wasp sting which resolved with benadryl. Whole body hives with hornet sting requiring Epipen. 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  Continue to avoid bee stings. See handout. Consider venom immunotherapy (injections - 3 injections) for this. There are two phases. During the build up phase you will come in once a week for 29 weeks, then every 2 weeks for one time then every 3 weeks for one time. Then you will reach maintenance phase which is every 4 weeks for 2 years. Then every 6 weeks for 2 years and then every 8 weeks until we decide to stop. I usually see patients on hymenoptera immunotherapy at least once a year as a follow up. Epinephrine injectable device - demonstrated proper use. For mild symptoms you can take over the counter antihistamines such as Benadryl 4 tsp = 43mL.and monitor symptoms closely. If symptoms worsen or if you have severe symptoms including breathing issues, throat closure, significant swelling, whole body hives, severe diarrhea and vomiting, lightheadedness then inject epinephrine and seek immediate medical care afterwards. Emergency action plan given. Recommend medical alert bracelet.

## 2022-05-08 LAB — TRYPTASE: Tryptase: 10.1 ug/L (ref 2.2–13.2)

## 2022-05-08 LAB — ALPHA-GAL PANEL
Allergen Lamb IgE: 0.27 kU/L — AB
Beef IgE: 0.4 kU/L — AB
IgE (Immunoglobulin E), Serum: 373 IU/mL (ref 6–495)
O215-IgE Alpha-Gal: 2 kU/L — AB
Pork IgE: 0.17 kU/L — AB

## 2022-07-19 ENCOUNTER — Encounter: Payer: Self-pay | Admitting: Family Medicine

## 2022-07-19 ENCOUNTER — Ambulatory Visit: Payer: Medicare Other | Admitting: Family Medicine

## 2022-07-19 VITALS — BP 123/84 | HR 60 | Temp 97.8°F | Wt 233.4 lb

## 2022-07-19 DIAGNOSIS — R7303 Prediabetes: Secondary | ICD-10-CM

## 2022-07-19 DIAGNOSIS — E782 Mixed hyperlipidemia: Secondary | ICD-10-CM | POA: Diagnosis not present

## 2022-07-19 DIAGNOSIS — Z789 Other specified health status: Secondary | ICD-10-CM | POA: Diagnosis not present

## 2022-07-19 DIAGNOSIS — I10 Essential (primary) hypertension: Secondary | ICD-10-CM | POA: Diagnosis not present

## 2022-07-19 LAB — POCT GLYCOSYLATED HEMOGLOBIN (HGB A1C)
HbA1c POC (<> result, manual entry): 5.3 % (ref 4.0–5.6)
HbA1c, POC (controlled diabetic range): 5.3 % (ref 0.0–7.0)
HbA1c, POC (prediabetic range): 5.3 % — AB (ref 5.7–6.4)
Hemoglobin A1C: 5.3 % (ref 4.0–5.6)

## 2022-07-19 MED ORDER — AMLODIPINE BESYLATE 10 MG PO TABS: ORAL_TABLET | ORAL | 1 refills | Status: DC

## 2022-07-19 MED ORDER — FUROSEMIDE 20 MG PO TABS: 10.0000 mg | ORAL_TABLET | Freq: Every day | ORAL | 1 refills | Status: DC

## 2022-07-19 MED ORDER — LISINOPRIL 10 MG PO TABS: 10.0000 mg | ORAL_TABLET | Freq: Every day | ORAL | 1 refills | Status: DC
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
## 2022-07-19 NOTE — Patient Instructions (Addendum)
Return in about 24 weeks (around 01/03/2023) for Routine chronic condition follow-up.  Fasting labs will be collected that day      Great to see you today.  I have refilled the medication(s) we provide.   If labs were collected, we will inform you of lab results once received either by echart message or telephone call.   - echart message- for normal results that have been seen by the patient already.   - telephone call: abnormal results or if patient has not viewed results in their echart.

## 2022-07-24 ENCOUNTER — Other Ambulatory Visit: Payer: Self-pay

## 2022-08-31 LAB — HM COLONOSCOPY

## 2022-11-13 NOTE — Progress Notes (Unsigned)
Follow Up Note  RE: Phillip [name] MRN: [id number] DOB: 1955/07/07 Date of Office Visit: 11/14/2022  Referring provider: Natalia Leatherwood, DO Primary care provider: Natalia Leatherwood, DO  Chief Complaint: No chief complaint on file.  History of Present Illness: I had the pleasure of seeing Phillip [name] for a follow up visit at the [facility] on 11/13/2022. He is a 67 y.o. male, who is being followed for alpha gal allergy, hymenoptera allergy. His previous allergy office visit was on 05/04/2022 with Dr. Selena Batten. Today is a regular follow up visit.  Reviewed bloodwork. It was positive to alpha-gal and your tryptase which checks for mast cell issues was normal.   Continue strict avoidance of all mammalian meat. Avoid dairy products as well.   Will recheck in 6-12 months.  Gastrointestinal complaints Noted a change in his BM x 1 year. Improved with dairy and red meat elimination but not back to baseline. Concerned about alpha-gal allergy. 2023 bloodwork was negative to common foods. No indication for any skin prick testing today.  Continue strict avoidance of all mammalian meat. Avoid dairy products as well. See handout on alpha-gal. Get bloodwork If negative will discuss food reintroduction. Follow up with GI as scheduled for next year.   Hymenoptera allergy Whole body hives after wasp sting which resolved with benadryl. Whole body hives with hornet sting requiring Epipen. 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  Continue to avoid bee stings. See handout. Consider venom immunotherapy (injections - 3 injections) for this. There are two phases. During the build up phase you will come in once a week for 29 weeks, then every 2 weeks for one time then every 3 weeks for one time. Then you will reach maintenance phase which is every 4 weeks for 2 years. Then every 6 weeks for 2 years and then every 8 weeks until we decide  to stop. I usually see patients on hymenoptera immunotherapy at least once a year as a follow up. Epinephrine injectable device - demonstrated proper use. For mild symptoms you can take over the counter antihistamines such as Benadryl 4 tsp = 20mL.and monitor symptoms closely. If symptoms worsen or if you have severe symptoms including breathing issues, throat closure, significant swelling, whole body hives, severe diarrhea and vomiting, lightheadedness then inject epinephrine and seek immediate medical care afterwards. Emergency action plan given. Recommend medical alert bracelet.   Return in about 6 months (around 11/03/2022).   No orders of the defined types were placed in this encounter.   Lab Orders         Alpha-Gal Panel         Tryptase       Assessment and Plan: Phillip [name] is a 67 y.o. male with: No problem-specific Assessment & Plan notes found for this encounter.  No follow-ups on file.  No orders of the defined types were placed in this encounter.  Lab Orders  No laboratory test(s) ordered today    Diagnostics: Spirometry:  Tracings reviewed. His effort: {Blank single:19197::"Good reproducible efforts.","It was hard to get consistent efforts and there is a question as to whether this reflects a maximal maneuver.","Poor effort, data can not be interpreted."} FVC: ***L FEV1: ***L, ***% predicted FEV1/FVC ratio: ***% Interpretation: {Blank single:19197::"Spirometry consistent with mild obstructive disease","Spirometry consistent with moderate obstructive disease","Spirometry consistent with severe obstructive disease","Spirometry consistent with possible restrictive disease","Spirometry consistent with mixed obstructive and restrictive disease","Spirometry uninterpretable due to  technique","Spirometry consistent with normal pattern","No overt abnormalities noted given today's efforts"}.  Please see scanned spirometry results for details.  Skin Testing: {Blank  single:19197::"Select foods","Environmental allergy panel","Environmental allergy panel and select foods","Food allergy panel","None","Deferred due to recent antihistamines use"}. *** Results discussed with patient/family.   Medication List:  Current Outpatient Medications  Medication Sig Dispense Refill   amLODipine (NORVASC) 10 MG tablet 1 tab daily 90 tablet 1   EPINEPHrine 0.3 mg/0.3 mL IJ SOAJ injection Inject 0.3 mg into the muscle as needed for anaphylaxis. 1 each 0   furosemide (LASIX) 20 MG tablet Take 0.5 tablets (10 mg total) by mouth daily. 45 tablet 1   lisinopril (ZESTRIL) 10 MG tablet Take 1 tablet (10 mg total) by mouth daily. 90 tablet 1   vitamin C (ASCORBIC ACID) 500 MG tablet Take 500 mg by mouth daily.     zinc gluconate 50 MG tablet Take 50 mg by mouth daily.     No current facility-administered medications for this visit.   Allergies: Allergies  Allergen Reactions   Wasp Venom Anaphylaxis   Statins     myalgias   Zetia [Ezetimibe]     confusion   I reviewed his past medical history, social history, family history, and environmental history and no significant changes have been reported from his previous visit.  Review of Systems  Constitutional:  Negative for appetite change, chills, fever and unexpected weight change.  HENT:  Negative for congestion and rhinorrhea.   Eyes:  Negative for itching.  Respiratory:  Negative for cough, chest tightness, shortness of breath and wheezing.   Cardiovascular:  Negative for chest pain.  Gastrointestinal:  Negative for abdominal pain, constipation, diarrhea, nausea and vomiting.  Genitourinary:  Negative for difficulty urinating.  Skin:  Negative for rash.  Allergic/Immunologic: Positive for food allergies.  Neurological:  Negative for headaches.    Objective: There were no vitals taken for this visit. There is no height or weight on file to calculate BMI. Physical Exam Vitals and nursing note reviewed.   Constitutional:      Appearance: Normal appearance. He is well-developed.  HENT:     Head: Normocephalic and atraumatic.     Right Ear: Tympanic membrane and external ear normal.     Left Ear: Tympanic membrane and external ear normal.     Nose: Nose normal.     Mouth/Throat:     Mouth: Mucous membranes are moist.     Pharynx: Oropharynx is clear.  Eyes:     Conjunctiva/sclera: Conjunctivae normal.  Cardiovascular:     Rate and Rhythm: Normal rate and regular rhythm.     Heart sounds: Normal heart sounds. No murmur heard.    No friction rub. No gallop.  Pulmonary:     Effort: Pulmonary effort is normal.     Breath sounds: Normal breath sounds. No wheezing, rhonchi or rales.  Musculoskeletal:     Cervical back: Neck supple.  Skin:    General: Skin is warm.     Findings: No rash.  Neurological:     Mental Status: He is alert and oriented to person, place, and time.  Psychiatric:        Behavior: Behavior normal.    Previous notes and tests were reviewed. The plan was reviewed with the patient/family, and all questions/concerned were addressed.  It was my pleasure to see Phillip [name] today and participate in his care. Please feel free to contact me with any questions or concerns.  Sincerely,  Wyline Mood, DO  Allergy & Immunology  [facility] office: [place] office: [phone]

## 2022-11-14 ENCOUNTER — Encounter: Payer: Self-pay | Admitting: Allergy

## 2022-11-14 ENCOUNTER — Ambulatory Visit (INDEPENDENT_AMBULATORY_CARE_PROVIDER_SITE_OTHER): Payer: Medicare Other | Admitting: Allergy

## 2022-11-14 ENCOUNTER — Other Ambulatory Visit: Payer: Self-pay

## 2022-11-14 VITALS — BP 128/82 | HR 54 | Temp 98.3°F | Resp 16 | Ht 73.0 in | Wt 231.5 lb

## 2022-11-14 DIAGNOSIS — Z91018 Allergy to other foods: Secondary | ICD-10-CM

## 2022-11-14 DIAGNOSIS — Z91038 Other insect allergy status: Secondary | ICD-10-CM | POA: Diagnosis not present

## 2022-11-14 NOTE — Assessment & Plan Note (Signed)
Past history - noted a change in his BM x 1 year. Improved with dairy and red meat elimination but not back to baseline. Concerned about alpha-gal allergy. 2023 bloodwork was negative to common foods. Interim history - 2023 alpha gal panel positive. No issues if avoiding mammalian meat and dairy. Had unremarkable colonoscopy.  Continue strict avoidance of all mammalian meat. Avoid dairy products as well. Avoid tick bites.  Get bloodwork in December 2024.

## 2022-11-14 NOTE — Patient Instructions (Addendum)
Food Continue strict avoidance of all mammalian meat. Avoid dairy products as well. Avoid tick bites.  Get bloodwork in December 2024.   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 Consider venom immunotherapy (injections - 3 injections) for this. There are two phases. During the build up phase you will come in once a week for 29 weeks, then every 2 weeks for one time then every 3 weeks for one time. Then you will reach maintenance phase which is every 4 weeks for 2 years. Then every 6 weeks for 2 years and then every 8 weeks until we decide to stop. I usually see patients on hymenoptera immunotherapy at least once a year as a follow up. For mild symptoms you can take over the counter antihistamines such as Benadryl 4 tsp = 20mL.and monitor symptoms closely. If symptoms worsen or if you have severe symptoms including breathing issues, throat closure, significant swelling, whole body hives, severe diarrhea and vomiting, lightheadedness then inject epinephrine and seek immediate medical care afterwards. Emergency action plan in place.  Recommend medical alert bracelet.  Follow up in 12 months or sooner if needed.

## 2022-11-14 NOTE — Assessment & Plan Note (Signed)
Past history - whole body hives after wasp sting which resolved with benadryl. Whole body hives with hornet sting requiring Epipen. 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  Interim history - stung by hornet x 2, took benadryl and only had 1 hive.  Continue to avoid bee stings. Consider venom immunotherapy (injections - 3 injections) for this. Declined today. There are two phases. During the build up phase you will come in once a week for 29 weeks, then every 2 weeks for one time then every 3 weeks for one time. Then you will reach maintenance phase which is every 4 weeks for 2 years. Then every 6 weeks for 2 years and then every 8 weeks until we decide to stop. I usually see patients on hymenoptera immunotherapy at least once a year as a follow up. For mild symptoms you can take over the counter antihistamines such as Benadryl 4 tsp = 20mL.and monitor symptoms closely. If symptoms worsen or if you have severe symptoms including breathing issues, throat closure, significant swelling, whole body hives, severe diarrhea and vomiting, lightheadedness then inject epinephrine and seek immediate medical care afterwards. Emergency action plan in place.  Recommend medical alert bracelet.

## 2023-01-03 ENCOUNTER — Ambulatory Visit (INDEPENDENT_AMBULATORY_CARE_PROVIDER_SITE_OTHER): Payer: Medicare Other | Admitting: Family Medicine

## 2023-01-03 ENCOUNTER — Encounter: Payer: Self-pay | Admitting: Family Medicine

## 2023-01-03 VITALS — BP 117/80 | HR 61 | Temp 97.5°F | Wt 228.6 lb

## 2023-01-03 DIAGNOSIS — I10 Essential (primary) hypertension: Secondary | ICD-10-CM

## 2023-01-03 DIAGNOSIS — Z125 Encounter for screening for malignant neoplasm of prostate: Secondary | ICD-10-CM | POA: Diagnosis not present

## 2023-01-03 DIAGNOSIS — E782 Mixed hyperlipidemia: Secondary | ICD-10-CM

## 2023-01-03 DIAGNOSIS — Z23 Encounter for immunization: Secondary | ICD-10-CM | POA: Diagnosis not present

## 2023-01-03 DIAGNOSIS — Z789 Other specified health status: Secondary | ICD-10-CM | POA: Diagnosis not present

## 2023-01-03 DIAGNOSIS — R7303 Prediabetes: Secondary | ICD-10-CM

## 2023-01-03 LAB — LIPID PANEL
Cholesterol: 185 mg/dL (ref 0–200)
HDL: 53.3 mg/dL (ref >39.00)
LDL Cholesterol: 114 mg/dL — ABNORMAL HIGH (ref 0–99)
NonHDL: 131.36
Total CHOL/HDL Ratio: 3
Triglycerides: 88 mg/dL (ref 0.0–149.0)
VLDL: 17.6 mg/dL (ref 0.0–40.0)

## 2023-01-03 LAB — POCT GLYCOSYLATED HEMOGLOBIN (HGB A1C)
HbA1c POC (<> result, manual entry): 5.3 % (ref 4.0–5.6)
HbA1c, POC (controlled diabetic range): 5.3 % (ref 0.0–7.0)
HbA1c, POC (prediabetic range): 5.3 % — AB (ref 5.7–6.4)
Hemoglobin A1C: 5.3 % (ref 4.0–5.6)

## 2023-01-03 LAB — PSA, MEDICARE: PSA: 2.92 ng/ml (ref 0.10–4.00)

## 2023-01-03 LAB — TSH: TSH: 2.42 u[IU]/mL (ref 0.35–5.50)

## 2023-01-03 LAB — COMPREHENSIVE METABOLIC PANEL
ALT: 19 U/L (ref 0–53)
AST: 18 U/L (ref 0–37)
Albumin: 4.6 g/dL (ref 3.5–5.2)
Alkaline Phosphatase: 41 U/L (ref 39–117)
BUN: 15 mg/dL (ref 6–23)
CO2: 26 mEq/L (ref 19–32)
Calcium: 9.8 mg/dL (ref 8.4–10.5)
Chloride: 104 mEq/L (ref 96–112)
Creatinine, Ser: 1.16 mg/dL (ref 0.40–1.50)
GFR: 65.78 mL/min (ref >60.00)
Glucose, Bld: 99 mg/dL (ref 70–99)
Potassium: 4.5 mEq/L (ref 3.5–5.1)
Sodium: 136 mEq/L (ref 135–145)
Total Bilirubin: 0.7 mg/dL (ref 0.2–1.2)
Total Protein: 6.9 g/dL (ref 6.0–8.3)

## 2023-01-03 LAB — CBC
HCT: 43.2 % (ref 39.0–52.0)
Hemoglobin: 14 g/dL (ref 13.0–17.0)
MCHC: 32.3 g/dL (ref 30.0–36.0)
MCV: 90.6 fl (ref 78.0–100.0)
Platelets: 201 10*3/uL (ref 150.0–400.0)
RBC: 4.77 Mil/uL (ref 4.22–5.81)
RDW: 13.7 % (ref 11.5–15.5)
WBC: 4.3 10*3/uL (ref 4.0–10.5)

## 2023-01-03 MED ORDER — LISINOPRIL 10 MG PO TABS: 10.0000 mg | ORAL_TABLET | Freq: Every day | ORAL | 1 refills | Status: DC

## 2023-01-03 MED ORDER — AMLODIPINE BESYLATE 10 MG PO TABS: ORAL_TABLET | ORAL | 1 refills | Status: DC

## 2023-01-03 MED ORDER — FUROSEMIDE 20 MG PO TABS: 10.0000 mg | ORAL_TABLET | Freq: Every day | ORAL | 1 refills | Status: DC
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
## 2023-01-03 NOTE — Patient Instructions (Addendum)
Return in about 24 weeks (around 06/20/2023) for cpe (20 min), Routine chronic condition follow-up.  Good plant sources of iron are lentils, chickpeas, beans, tofu, cashew nuts, pumpkin seeds, kale, dried apricots and figs, raisins, quinoa and fortified breakfast cereal.  B12 1000 mcg daily       Great to see you today.  I have refilled the medication(s) we provide.   If labs were collected or images ordered, we will inform you of  results once we have received them and reviewed. We will contact you either by echart message, or telephone call.  Please give ample time to the testing facility, and our office to run,  receive and review results. Please do not call inquiring of results, even if you can see them in your chart. We will contact you as soon as we are able. If it has been over 1 week since the test was completed, and you have not yet heard from [place], then please call us.    - echart message- for normal results that have been seen by the patient already.   - telephone call: abnormal results or if patient has not viewed results in their echart.  If a referral to a specialist was entered for you, please call us in 2 weeks if you have not heard from the specialist office to schedule.

## 2023-01-11 ENCOUNTER — Encounter (INDEPENDENT_AMBULATORY_CARE_PROVIDER_SITE_OTHER): Payer: Self-pay

## 2023-01-24 NOTE — Addendum Note (Signed)
Addended by: Filomena Jungling on: 01/24/2023 08:33 AM   Modules accepted: Orders

## 2023-01-26 NOTE — Addendum Note (Signed)
Addended by: Filomena Jungling on: 01/26/2023 08:29 AM   Modules accepted: Orders

## 2023-02-05 ENCOUNTER — Other Ambulatory Visit: Payer: Self-pay | Admitting: Oncology

## 2023-02-05 DIAGNOSIS — Z006 Encounter for examination for normal comparison and control in clinical research program: Secondary | ICD-10-CM

## 2023-02-22 ENCOUNTER — Ambulatory Visit: Payer: BLUE CROSS/BLUE SHIELD

## 2023-02-23 ENCOUNTER — Ambulatory Visit (INDEPENDENT_AMBULATORY_CARE_PROVIDER_SITE_OTHER): Payer: Medicare Other

## 2023-02-23 DIAGNOSIS — Z0000 Encounter for general adult medical examination without abnormal findings: Secondary | ICD-10-CM
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
## 2023-02-23 NOTE — Patient Instructions (Signed)
Health Maintenance, Male Adopting a healthy lifestyle and getting preventive care are important in promoting health and wellness. Ask your health care provider about: The right schedule for you to have regular tests and exams. Things you can do on your own to prevent diseases and keep yourself healthy. What should I know about diet, weight, and exercise? Eat a healthy diet  Eat a diet that includes plenty of vegetables, fruits, low-fat dairy products, and lean protein. Do not eat a lot of foods that are high in solid fats, added sugars, or sodium. Maintain a healthy weight Body mass index (BMI) is a measurement that can be used to identify possible weight problems. It estimates body fat based on height and weight. Your health care provider can help determine your BMI and help you achieve or maintain a healthy weight. Get regular exercise Get regular exercise. This is one of the most important things you can do for your health. Most adults should: Exercise for at least 150 minutes each week. The exercise should increase your heart rate and make you sweat (moderate-intensity exercise). Do strengthening exercises at least twice a week. This is in addition to the moderate-intensity exercise. Spend less time sitting. Even light physical activity can be beneficial. Watch cholesterol and blood lipids Have your blood tested for lipids and cholesterol at 66 years of age, then have this test every 5 years. You may need to have your cholesterol levels checked more often if: Your lipid or cholesterol levels are high. You are older than 66 years of age. You are at high risk for heart disease. What should I know about cancer screening? Many types of cancers can be detected early and may often be prevented. Depending on your health history and family history, you may need to have cancer screening at various ages. This may include screening for: Colorectal cancer. Prostate cancer. Skin cancer. Lung  cancer. What should I know about heart disease, diabetes, and high blood pressure? Blood pressure and heart disease High blood pressure causes heart disease and increases the risk of stroke. This is more likely to develop in people who have high blood pressure readings or are overweight. Talk with your health care provider about your target blood pressure readings. Have your blood pressure checked: Every 3-5 years if you are 18-39 years of age. Every year if you are 40 years old or older. If you are between the ages of 65 and 75 and are a current or former smoker, ask your health care provider if you should have a one-time screening for abdominal aortic aneurysm (AAA). Diabetes Have regular diabetes screenings. This checks your fasting blood sugar level. Have the screening done: Once every three years after age 45 if you are at a normal weight and have a low risk for diabetes. More often and at a younger age if you are overweight or have a high risk for diabetes. What should I know about preventing infection? Hepatitis B If you have a higher risk for hepatitis B, you should be screened for this virus. Talk with your health care provider to find out if you are at risk for hepatitis B infection. Hepatitis C Blood testing is recommended for: Everyone born from 1945 through 1965. Anyone with known risk factors for hepatitis C. Sexually transmitted infections (STIs) You should be screened each year for STIs, including gonorrhea and chlamydia, if: You are sexually active and are younger than 66 years of age. You are older than 66 years of age and your   health care provider tells you that you are at risk for this type of infection. Your sexual activity has changed since you were last screened, and you are at increased risk for chlamydia or gonorrhea. Ask your health care provider if you are at risk. Ask your health care provider about whether you are at high risk for HIV. Your health care provider  may recommend a prescription medicine to help prevent HIV infection. If you choose to take medicine to prevent HIV, you should first get tested for HIV. You should then be tested every 3 months for as long as you are taking the medicine. Follow these instructions at home: Alcohol use Do not drink alcohol if your health care provider tells you not to drink. If you drink alcohol: Limit how much you have to 0-2 drinks a day. Know how much alcohol is in your drink. In the [place] one drink equals one 12 oz bottle of beer (355 mL), one 5 oz glass of wine (148 mL), or one 1 oz glass of hard liquor (44 mL). Lifestyle Do not use any products that contain nicotine or tobacco. These products include cigarettes, chewing tobacco, and vaping devices, such as e-cigarettes. If you need help quitting, ask your health care provider. Do not use street drugs. Do not share needles. Ask your health care provider for help if you need support or information about quitting drugs. General instructions Schedule regular health, dental, and eye exams. Stay current with your vaccines. Tell your health care provider if: You often feel depressed. You have ever been abused or do not feel safe at home. Summary Adopting a healthy lifestyle and getting preventive care are important in promoting health and wellness. Follow your health care provider's instructions about healthy diet, exercising, and getting tested or screened for diseases. Follow your health care provider's instructions on monitoring your cholesterol and blood pressure. This information is not intended to replace advice given to you by your health care provider. Make sure you discuss any questions you have with your health care provider. Document Revised: 10/04/2020 Document Reviewed: 10/04/2020 Elsevier Patient Education  2024 [employer] Inc[employer]  

## 2023-03-20 ENCOUNTER — Other Ambulatory Visit (HOSPITAL_COMMUNITY): Payer: BLUE CROSS/BLUE SHIELD

## 2023-03-28 ENCOUNTER — Other Ambulatory Visit (HOSPITAL_COMMUNITY)
Admission: RE | Admit: 2023-03-28 | Discharge: 2023-03-28 | Disposition: A | Discharge status: Home / Self Care | Payer: BLUE CROSS/BLUE SHIELD | Source: Ambulatory Visit | Attending: Oncology | Admitting: Oncology

## 2023-03-28 DIAGNOSIS — Z006 Encounter for examination for normal comparison and control in clinical research program: Secondary | ICD-10-CM | POA: Insufficient documentation

## 2023-04-03 LAB — HELIX MOLECULAR SCREEN: Genetic Analysis Overall Interpretation: NEGATIVE

## 2023-04-03 LAB — GENECONNECT MOLECULAR SCREEN

## 2023-05-07 LAB — ALPHA-GAL PANEL
Allergen Lamb IgE: 0.15 kU/L — AB
Beef IgE: 0.18 kU/L — AB
IgE (Immunoglobulin E), Serum: 272 [IU]/mL (ref 6–495)
O215-IgE Alpha-Gal: 0.58 kU/L — AB
Pork IgE: 0.1 kU/L — AB

## 2023-05-08 ENCOUNTER — Encounter: Payer: Self-pay | Admitting: Allergy

## 2023-05-08 NOTE — Telephone Encounter (Signed)
Spoke with patient.  A few hours after eating beef has bloating, abdominal pain, diarrhea and brain fog.  No other symptoms.   The GI symptoms take a few days to resolve.   Still has issues when smelling red meat.   We will continue to avoid red meat for now. Re-assess at next visit.

## 2023-06-20 NOTE — Patient Instructions (Signed)
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
## 2023-06-21 ENCOUNTER — Ambulatory Visit: Payer: Medicare Other | Admitting: Family Medicine

## 2023-06-21 ENCOUNTER — Encounter: Payer: Self-pay | Admitting: Family Medicine

## 2023-06-21 VITALS — BP 130/82 | HR 64 | Temp 98.2°F | Ht 73.82 in | Wt 238.0 lb

## 2023-06-21 DIAGNOSIS — I10 Essential (primary) hypertension: Secondary | ICD-10-CM

## 2023-06-21 DIAGNOSIS — Z125 Encounter for screening for malignant neoplasm of prostate: Secondary | ICD-10-CM | POA: Diagnosis not present

## 2023-06-21 DIAGNOSIS — Z1211 Encounter for screening for malignant neoplasm of colon: Secondary | ICD-10-CM

## 2023-06-21 DIAGNOSIS — Z0000 Encounter for general adult medical examination without abnormal findings: Secondary | ICD-10-CM | POA: Diagnosis not present

## 2023-06-21 DIAGNOSIS — I872 Venous insufficiency (chronic) (peripheral): Secondary | ICD-10-CM | POA: Diagnosis not present

## 2023-06-21 DIAGNOSIS — R7303 Prediabetes: Secondary | ICD-10-CM | POA: Diagnosis not present

## 2023-06-21 DIAGNOSIS — E782 Mixed hyperlipidemia: Secondary | ICD-10-CM | POA: Diagnosis not present

## 2023-06-21 LAB — COMPREHENSIVE METABOLIC PANEL
ALT: 22 U/L (ref 0–53)
AST: 19 U/L (ref 0–37)
Albumin: 4.5 g/dL (ref 3.5–5.2)
Alkaline Phosphatase: 41 U/L (ref 39–117)
BUN: 11 mg/dL (ref 6–23)
CO2: 28 meq/L (ref 19–32)
Calcium: 9.5 mg/dL (ref 8.4–10.5)
Chloride: 102 meq/L (ref 96–112)
Creatinine, Ser: 1.18 mg/dL (ref 0.40–1.50)
GFR: 64.24 mL/min (ref >60.00)
Glucose, Bld: 102 mg/dL — ABNORMAL HIGH (ref 70–99)
Potassium: 4.1 meq/L (ref 3.5–5.1)
Sodium: 136 meq/L (ref 135–145)
Total Bilirubin: 0.6 mg/dL (ref 0.2–1.2)
Total Protein: 6.5 g/dL (ref 6.0–8.3)

## 2023-06-21 LAB — TSH: TSH: 2.85 u[IU]/mL (ref 0.35–5.50)

## 2023-06-21 LAB — CBC
HCT: 41.2 % (ref 39.0–52.0)
Hemoglobin: 13.6 g/dL (ref 13.0–17.0)
MCHC: 33.1 g/dL (ref 30.0–36.0)
MCV: 89.9 fL (ref 78.0–100.0)
Platelets: 197 10*3/uL (ref 150.0–400.0)
RBC: 4.58 Mil/uL (ref 4.22–5.81)
RDW: 13.4 % (ref 11.5–15.5)
WBC: 4.9 10*3/uL (ref 4.0–10.5)

## 2023-06-21 LAB — LIPID PANEL
Cholesterol: 173 mg/dL (ref 0–200)
HDL: 49.4 mg/dL (ref >39.00)
LDL Cholesterol: 105 mg/dL — ABNORMAL HIGH (ref 0–99)
NonHDL: 123.94
Total CHOL/HDL Ratio: 4
Triglycerides: 93 mg/dL (ref 0.0–149.0)
VLDL: 18.6 mg/dL (ref 0.0–40.0)

## 2023-06-21 LAB — HEMOGLOBIN A1C: Hgb A1c MFr Bld: 6 % (ref 4.6–6.5)

## 2023-06-21 MED ORDER — TRIAMCINOLONE ACETONIDE 0.1 % EX CREA: 1.0000 | TOPICAL_CREAM | Freq: Two times a day (BID) | CUTANEOUS | 5 refills | Status: DC | PRN

## 2023-06-21 MED ORDER — LISINOPRIL 10 MG PO TABS: 10.0000 mg | ORAL_TABLET | Freq: Every day | ORAL | 1 refills | Status: DC

## 2023-06-21 MED ORDER — FUROSEMIDE 20 MG PO TABS: 10.0000 mg | ORAL_TABLET | Freq: Every day | ORAL | 1 refills | Status: DC

## 2023-06-21 MED ORDER — AMLODIPINE BESYLATE 10 MG PO TABS: ORAL_TABLET | ORAL | 1 refills | Status: DC

## 2023-06-22 ENCOUNTER — Encounter: Payer: Self-pay | Admitting: Family Medicine

## 2023-11-12 NOTE — Progress Notes (Unsigned)
 Follow Up Note  RE: Phillip [name] MRN: [id number] DOB: 05-Jul-1955 Date of Office Visit: 11/13/2023  Referring provider: Mariel Shope, DO Primary care provider: Mariel Shope, DO  Chief Complaint: No chief complaint on file.  History of Present Illness: I had the pleasure of seeing Phillip [name] for a follow up visit at the [facility] on 11/13/2023. He is a 68 y.o. male, who is being followed for alpha gal allergy and hymenoptera allergy. His previous allergy office visit was on 11/14/2022 with Dr. Burdette Carolin. Today is a regular follow up visit.  Discussed the use of AI scribe software for clinical note transcription with the patient, who gave verbal consent to proceed.  History of Present Illness            2024 labs: Your alpha gal levels are decreasing.   Assessment and Plan: Phillip [name] is a 68 y.o. male with: Allergy to alpha-gal Past history - noted a change in his BM x 1 year. Improved with dairy and red meat elimination but not back to baseline. Concerned about alpha-gal allergy. 2023 bloodwork was negative to common foods. Interim history - 2023 alpha gal panel positive. No issues if avoiding mammalian meat and dairy. Had unremarkable colonoscopy.  Continue strict avoidance of all mammalian meat. Avoid dairy products as well. Avoid tick bites.  Get bloodwork in December 2024.    Hymenoptera allergy Past history - whole body hives after wasp sting which resolved with benadryl . Whole body hives with hornet sting requiring Epipen . 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  Interim history - stung by hornet x 2, took benadryl  and only had 1 hive.  Continue to avoid bee stings. Consider venom immunotherapy (injections - 3 injections) for this. Declined today. There are two phases. During the build up phase you will come in once a week for 29 weeks, then every 2 weeks for one time then every 3 weeks for one  time. Then you will reach maintenance phase which is every 4 weeks for 2 years. Then every 6 weeks for 2 years and then every 8 weeks until we decide to stop. I usually see patients on hymenoptera immunotherapy at least once a year as a follow up. For mild symptoms you can take over the counter antihistamines such as Benadryl  4 tsp = 20mL.and monitor symptoms closely. If symptoms worsen or if you have severe symptoms including breathing issues, throat closure, significant swelling, whole body hives, severe diarrhea and vomiting, lightheadedness then inject epinephrine  and seek immediate medical care afterwards. Emergency action plan in place.  Recommend medical alert bracelet. Assessment and Plan              No follow-ups on file.  No orders of the defined types were placed in this encounter.  Lab Orders  No laboratory test(s) ordered today    Diagnostics: Spirometry:  Tracings reviewed. His effort: {Blank single:19197::Good reproducible efforts.,It was hard to get consistent efforts and there is a question as to whether this reflects a maximal maneuver.,Poor effort, data can not be interpreted.} FVC: ***L FEV1: ***L, ***% predicted FEV1/FVC ratio: ***% Interpretation: {Blank single:19197::Spirometry consistent with mild obstructive disease,Spirometry consistent with moderate obstructive disease,Spirometry consistent with severe obstructive disease,Spirometry consistent with possible restrictive disease,Spirometry consistent with mixed obstructive and restrictive disease,Spirometry uninterpretable due to technique,Spirometry consistent with normal pattern,No overt abnormalities noted given today's efforts}.  Please see scanned spirometry results for details.  Skin  Testing: {Blank single:19197::Select foods,Environmental allergy panel,Environmental allergy panel and select foods,Food allergy panel,None,Deferred due to recent antihistamines  use}. *** Results discussed with patient/family.   Medication List:  Current Outpatient Medications  Medication Sig Dispense Refill   amLODipine  (NORVASC ) 10 MG tablet 1 tab daily 90 tablet 1   EPINEPHrine  0.3 mg/0.3 mL IJ SOAJ injection Inject 0.3 mg into the muscle as needed for anaphylaxis. 1 each 0   furosemide  (LASIX ) 20 MG tablet Take 0.5 tablets (10 mg total) by mouth daily. 45 tablet 1   lisinopril  (ZESTRIL ) 10 MG tablet Take 1 tablet (10 mg total) by mouth daily. 90 tablet 1   triamcinolone  cream (KENALOG ) 0.1 % Apply 1 Application topically 2 (two) times daily as needed. To left shin 45 g 5   vitamin C (ASCORBIC ACID) 500 MG tablet Take 500 mg by mouth daily.     zinc gluconate 50 MG tablet Take 50 mg by mouth daily.     No current facility-administered medications for this visit.   Allergies: Allergies  Allergen Reactions   Wasp Venom Anaphylaxis   Alpha-Gal Diarrhea   Statins     myalgias   Zetia [Ezetimibe]     confusion   I reviewed his past medical history, social history, family history, and environmental history and no significant changes have been reported from his previous visit.  Review of Systems  Constitutional:  Negative for appetite change, chills, fever and unexpected weight change.  HENT:  Negative for congestion and rhinorrhea.   Eyes:  Negative for itching.  Respiratory:  Negative for cough, chest tightness, shortness of breath and wheezing.   Cardiovascular:  Negative for chest pain.  Gastrointestinal:  Negative for abdominal pain, constipation, diarrhea, nausea and vomiting.  Genitourinary:  Negative for difficulty urinating.  Skin:  Negative for rash.  Allergic/Immunologic: Positive for food allergies.  Neurological:  Negative for headaches.    Objective: There were no vitals taken for this visit. There is no height or weight on file to calculate BMI. Physical Exam Vitals and nursing note reviewed.  Constitutional:      Appearance:  Normal appearance. He is well-developed.  HENT:     Head: Normocephalic and atraumatic.     Right Ear: Tympanic membrane and external ear normal.     Left Ear: Tympanic membrane and external ear normal.     Nose: Nose normal.     Mouth/Throat:     Mouth: Mucous membranes are moist.     Pharynx: Oropharynx is clear.   Eyes:     Conjunctiva/sclera: Conjunctivae normal.    Cardiovascular:     Rate and Rhythm: Normal rate and regular rhythm.     Heart sounds: Normal heart sounds. No murmur heard.    No friction rub. No gallop.  Pulmonary:     Effort: Pulmonary effort is normal.     Breath sounds: Normal breath sounds. No wheezing, rhonchi or rales.   Musculoskeletal:     Cervical back: Neck supple.   Skin:    General: Skin is warm.     Findings: No rash.   Neurological:     Mental Status: He is alert and oriented to person, place, and time.   Psychiatric:        Behavior: Behavior normal.    Previous notes and tests were reviewed. The plan was reviewed with the patient/family, and all questions/concerned were addressed.  It was my pleasure to see Phillip [name] today and participate in his care. Please feel free to  contact me with any questions or concerns.  Sincerely,  Eudelia Hero, DO Allergy & Immunology  [facility]  [facility] office: [phone] [facility] office: [phone]

## 2023-11-13 ENCOUNTER — Other Ambulatory Visit: Payer: Self-pay

## 2023-11-13 ENCOUNTER — Encounter: Payer: Self-pay | Admitting: Allergy

## 2023-11-13 ENCOUNTER — Ambulatory Visit (INDEPENDENT_AMBULATORY_CARE_PROVIDER_SITE_OTHER): Payer: Medicare Other | Admitting: Allergy

## 2023-11-13 VITALS — BP 130/86 | HR 53 | Temp 98.2°F | Resp 18 | Ht 73.23 in | Wt 232.8 lb

## 2023-11-13 DIAGNOSIS — Z91038 Other insect allergy status: Secondary | ICD-10-CM

## 2023-11-13 DIAGNOSIS — Z91018 Allergy to other foods: Secondary | ICD-10-CM

## 2023-11-13 MED ORDER — EPINEPHRINE 0.3 MG/0.3ML IJ SOAJ: 0.3000 mg | INTRAMUSCULAR | 1 refills | Status: AC | PRN

## 2023-11-13 NOTE — Patient Instructions (Addendum)
 Food Continue strict avoidance of all mammalian meat. Avoid dairy products as well. Avoid tick bites.  Get bloodwork.   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 Consider venom immunotherapy (injections - 3 injections) for this. For mild symptoms you can take over the counter antihistamines (zyrtec 10mg  to 20mg ) and monitor symptoms closely.  If symptoms worsen or if you have severe symptoms including breathing issues, throat closure, significant swelling, whole body hives, severe diarrhea and vomiting, lightheadedness then use epinephrine  and seek immediate medical care afterwards. Emergency action plan in place.  Recommend medical alert bracelet.  Follow up in 12 months or sooner if needed.   Alpha-gal and Red Meat Allergy   Overview An allergy to "alpha-gal" refers to having a severe and potentially life-threatening allergy to a carbohydrate molecule called galactose-alpha-1,3-galactose that is found in most mammalian or "red meat". Unlike other food allergies which typically occur within minutes of ingestion, symptoms from eating red meat such as pork, lamb or beef may be delayed, occurring 3-8 hours after eating. Most food allergies are directed against a protein molecule, but alpha-gal is unusual because it is a carbohydrate, and a delay in its absorption may explain the delay in symptoms.  Helpful website: [employer]Festival[employer]is  What are the symptoms of an alpha-gal allergy? As with other food allergies, signs or symptoms of an allergy to alpha-gal may include: Hives and itching  Swelling of your lips, face or eyelids  Shortness of breath, cough or wheezing  Abdominal pain, nausea, diarrhea or vomiting The most severe reaction, anaphylaxis, can present as a combination of several of these symptoms, may include low blood pressure, and is potentially fatal.  Because these symptoms are delayed, you may only wake up with them in the middle of the night after an evening  meal.  How is an alpha-gal allergy diagnosed? Diagnosis of this allergy starts with your allergist taking an appropriate history and physical examination. Because the onset is usually quite delayed, it can be hard to associate the symptoms with eating red meat many hours previously. Triggers include any red meat - including beef, pork, lamb or even horse products. It may occur after eating hotdogs and hamburgers. In very rare cases the reaction may extend to milk or dairy proteins and gelatin.  Your allergist may recommend testing that includes skin tests to the relevant animal proteins and blood tests which measure the levels of a specific immunoglobulin E (IgE) antibody, to mammalian meats. An investigational blood test, IgE against alpha-gal itself, may also aid in the diagnosis.  How is an alpha-gal allergy treated? Immediate symptoms such as hives or shortness of breath are treated the same as any other food allergy - in an urgent care setting with anti-histamines, epinephrine  and other medications. Prevention long-term involves avoidance of all red meat in sensitized individuals. You may be advised to carry an epinephrine  auto-injector, to be used in case of subsequent accidental exposures and reaction. These measures do not necessarily mean switching to a full vegetarian diet, since poultry and fish can be consumed and do not cause similar reactions. As with other food allergies, there is the possibility that over time the sensitivity diminishes - although these changes may take many years to become apparent.  How do you become allergic to alpha-gal? Alpha-gal is a molecule carried in the saliva of the [place] tick and other potential arthropods typically after feeding on mammalian blood. People that are bitten by the tick, especially those that are bitten repeatedly, are  at risk of becoming sensitized and producing the IgE necessary to then cause allergic reactions. Interestingly, allergic  reactions may occur to red meat, to subsequent tick bites, and even to medications that contain alpha-gal. Cetuximab is a cancer medication that contains alpha-gal, and people who have had allergic reactions to this medication (these are typically immediate reactions, because it is infused intravenously) have a higher risk for red meat allergy and are likely to have been bitten by ticks in the past. As might be expected, the incidence of tick bites is much higher in the [place] the traditional habitat for the tick. However, cases are now increasingly reported in the [place] and [place] states. And it is a phenomenon that has been observed worldwide, with different ticks responsible for similar cases of red meat allergy in many other countries such as [place], [place] and [place].  The discovery of this peculiar allergy has allowed researchers to correlate tick bites with many cases of anaphylaxis that would previously have been classified as 'idiopathic', or of unknown cause. Also, while it was originally thought that the [employer] tick had to feast on mammalian blood in order to carry the alpha-gal molecule, more recent research has shown that it may carry this molecule and be capable of sensitizing humans independently.  How do you prevent an alpha-gal allergy? Because this allergy is predominantly tick born, you are more likely at risk if you often go outdoors in wooded areas for activities such as hiking, fishing or hunting. The key strategy is to prevent tick bites. This may include wearing long sleeved shirts or pants, using appropriate insect repellants, and surveying for ticks after spending time outdoors. Any observed ticks should be removed carefully by cleaning the site with rubbing alcohol, then using tweezers to pull the tick's head up carefully from the skin using steady pressure. Clean your hands and the site one more time and make sure not to crush the tick between your  fingers.

## 2023-11-16 LAB — IGE MILK W/ COMPONENT REFLEX: F002-IgE Milk: <0.1 kU/L

## 2023-11-16 LAB — ALPHA-GAL PANEL
Allergen Lamb IgE: 0.11 kU/L — AB
Beef IgE: 0.24 kU/L — AB
IgE (Immunoglobulin E), Serum: 332 [IU]/mL (ref 6–495)
O215-IgE Alpha-Gal: 0.53 kU/L — AB
Pork IgE: <0.1 kU/L

## 2023-11-17 ENCOUNTER — Ambulatory Visit: Payer: Self-pay | Admitting: Allergy

## 2023-12-06 ENCOUNTER — Ambulatory Visit: Payer: BLUE CROSS/BLUE SHIELD | Admitting: Family Medicine

## 2023-12-13 ENCOUNTER — Ambulatory Visit: Admitting: Family Medicine

## 2023-12-13 ENCOUNTER — Encounter: Payer: Self-pay | Admitting: Family Medicine

## 2023-12-13 VITALS — BP 130/82 | HR 66 | Temp 98.1°F | Wt 230.2 lb

## 2023-12-13 DIAGNOSIS — G720 Drug-induced myopathy: Secondary | ICD-10-CM

## 2023-12-13 DIAGNOSIS — I10 Essential (primary) hypertension: Secondary | ICD-10-CM | POA: Diagnosis not present

## 2023-12-13 DIAGNOSIS — R7303 Prediabetes: Secondary | ICD-10-CM | POA: Diagnosis not present

## 2023-12-13 DIAGNOSIS — T466X5A Adverse effect of antihyperlipidemic and antiarteriosclerotic drugs, initial encounter: Secondary | ICD-10-CM | POA: Diagnosis not present

## 2023-12-13 LAB — POCT GLYCOSYLATED HEMOGLOBIN (HGB A1C)
HbA1c POC (<> result, manual entry): 5.4 % (ref 4.0–5.6)
HbA1c, POC (controlled diabetic range): 5.4 % (ref 0.0–7.0)
HbA1c, POC (prediabetic range): 5.4 % — AB (ref 5.7–6.4)
Hemoglobin A1C: 5.4 % (ref 4.0–5.6)

## 2023-12-13 MED ORDER — FUROSEMIDE 20 MG PO TABS: 20.0000 mg | ORAL_TABLET | Freq: Every day | ORAL | 1 refills | Status: DC

## 2023-12-13 MED ORDER — LISINOPRIL 10 MG PO TABS: 10.0000 mg | ORAL_TABLET | Freq: Every day | ORAL | 1 refills | Status: DC

## 2023-12-13 MED ORDER — AMLODIPINE BESYLATE 10 MG PO TABS: ORAL_TABLET | ORAL | 1 refills | Status: DC
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
## 2023-12-13 NOTE — Patient Instructions (Addendum)
 Return in about 6 months (around 06/03/2024) for Routine chronic condition follow-up.        Great to see you today.  I have refilled the medication(s) we provide.   If labs were collected or images ordered, we will inform you of  results once we have received them and reviewed. We will contact you either by echart message, or telephone call.  Please give ample time to the testing facility, and our office to run,  receive and review results. Please do not call inquiring of results, even if you can see them in your chart. We will contact you as soon as we are able. If it has been over 1 week since the test was completed, and you have not yet heard from us , then please call us .    - echart message- for normal results that have been seen by the patient already.   - telephone call: abnormal results or if patient has not viewed results in their echart.  If a referral to a specialist was entered for you, please call us  in 2 weeks if you have not heard from the specialist office to schedule.

## 2024-01-06 ENCOUNTER — Telehealth: Admitting: Family Medicine

## 2024-01-06 DIAGNOSIS — H1031 Unspecified acute conjunctivitis, right eye: Secondary | ICD-10-CM | POA: Diagnosis not present

## 2024-01-06 MED ORDER — POLYMYXIN B-TRIMETHOPRIM 10000-0.1 UNIT/ML-% OP SOLN: 1.0000 [drp] | OPHTHALMIC | 0 refills | Status: AC

## 2024-01-06 NOTE — Progress Notes (Signed)
 Virtual Visit Consent   Phillip [name], you are scheduled for a virtual visit with a [facility] provider today. Just as with appointments in the office, your consent must be obtained to participate. Your consent will be active for this visit and any virtual visit you may have with one of our providers in the next 365 days. If you have a MyChart account, a copy of this consent can be sent to you electronically.  As this is a virtual visit, video technology does not allow for your provider to perform a traditional examination. This may limit your provider's ability to fully assess your condition. If your provider identifies any concerns that need to be evaluated in person or the need to arrange testing (such as labs, EKG, etc.), we will make arrangements to do so. Although advances in technology are sophisticated, we cannot ensure that it will always work on either your end or our end. If the connection with a video visit is poor, the visit may have to be switched to a telephone visit. With either a video or telephone visit, we are not always able to ensure that we have a secure connection.  By engaging in this virtual visit, you consent to the provision of healthcare and authorize for your insurance to be billed (if applicable) for the services provided during this visit. Depending on your insurance coverage, you may receive a charge related to this service.  I need to obtain your verbal consent now. Are you willing to proceed with your visit today? Phillip [name] has provided verbal consent on 01/06/2024 for a virtual visit (video or telephone). Loa Lamp, FNP  Date: 01/06/2024 10:23 AM   Virtual Visit via Video Note   I, Loa Lamp, connected with  Phillip [name]  ([id number], 1955-12-15) on 01/06/24 at 10:30 AM EDT by a video-enabled telemedicine application and verified that I am speaking with the correct person using two identifiers.  Location: Patient: Virtual Visit Location Patient:  Home Provider: Virtual Visit Location Provider: Home Office   I discussed the limitations of evaluation and management by telemedicine and the availability of in person appointments. The patient expressed understanding and agreed to proceed.    History of Present Illness: Phillip [name] is a 68 y.o. who identifies as a male who was assigned male at birth, and is being seen today for redness and inflammation of rt eye with no injury. Waking with matting. Sx for 2-3 days worsening. Using allergy meds. SABRA  HPI: HPI  Problems:  Patient Active Problem List   Diagnosis Date Noted   Venous stasis dermatitis of left lower extremity 06/21/2023   Hymenoptera allergy 05/04/2022   Allergy to alpha-gal 05/04/2022   Nocturia 07/14/2019   Statin myopathy 07/14/2019   Prediabetes 01/07/2018   Hyperkalemia 01/07/2018   Hyperlipidemia 06/04/2017   Essential hypertension 06/04/2017    Allergies:  Allergies  Allergen Reactions   Wasp Venom Anaphylaxis   Alpha-Gal Diarrhea   Atorvastatin Other (See Comments)   Rosuvastatin Other (See Comments)   Statins     myalgias   Zetia [Ezetimibe]     confusion   Medications:  Current Outpatient Medications:    amLODipine  (NORVASC ) 10 MG tablet, 1 tab daily, Disp: 90 tablet, Rfl: 1   EPINEPHrine  0.3 mg/0.3 mL IJ SOAJ injection, Inject 0.3 mg into the muscle as needed for anaphylaxis., Disp: 1 each, Rfl: 1   furosemide  (LASIX ) 20 MG tablet, Take 1 tablet (20 mg total) by mouth daily., Disp:  90 tablet, Rfl: 1   lisinopril  (ZESTRIL ) 10 MG tablet, Take 1 tablet (10 mg total) by mouth daily., Disp: 90 tablet, Rfl: 1   vitamin C (ASCORBIC ACID) 500 MG tablet, Take 500 mg by mouth daily., Disp: , Rfl:    zinc gluconate 50 MG tablet, Take 50 mg by mouth daily., Disp: , Rfl:   Observations/Objective: Patient is well-developed, well-nourished in no acute distress.  Resting comfortably  at home.  Head is normocephalic, atraumatic.  No labored breathing.  Speech is  clear and coherent with logical content.  Patient is alert and oriented at baseline.    Assessment and Plan: 1. Acute bacterial conjunctivitis of right eye (Primary)  Cool compresses. UC if sx persist and do not improve in 24 hrs.   Follow Up Instructions: I discussed the assessment and treatment plan with the patient. The patient was provided an opportunity to ask questions and all were answered. The patient agreed with the plan and demonstrated an understanding of the instructions.  A copy of instructions were sent to the patient via MyChart unless otherwise noted below.    The patient was advised to call back or seek an in-person evaluation if the symptoms worsen or if the condition fails to improve as anticipated.    Joliana Claflin, FNP

## 2024-01-06 NOTE — Patient Instructions (Signed)
 Bacterial Conjunctivitis, Adult Bacterial conjunctivitis is an infection of the clear membrane that covers the white part of the eye and the inner surface of the eyelid (conjunctiva). When the blood vessels in the conjunctiva become inflamed, the eye becomes red or pink. The eye often feels irritated or itchy. Bacterial conjunctivitis spreads easily from person to person (is contagious). It also spreads easily from one eye to the other eye. What are the causes? This condition is caused by bacteria. You may get the infection if you come into close contact with: A person who is infected with the bacteria. Items that are contaminated with the bacteria, such as a face towel, contact lens solution, or eye makeup. What increases the risk? You are more likely to develop this condition if: You are exposed to other people who have the infection. You wear contact lenses. You have a sinus infection. You have had a recent eye injury or surgery. You have a weak body defense system (immune system). You have a medical condition that causes dry eyes. What are the signs or symptoms? Symptoms of this condition include: Thick, yellowish discharge from the eye. This may turn into a crust on the eyelid overnight and cause your eyelids to stick together. Tearing or watery eyes. Itchy eyes. Burning feeling in your eyes. Eye redness. Swollen eyelids. Blurred vision. How is this diagnosed? This condition is diagnosed based on your symptoms and medical history. Your health care provider may also take a sample of discharge from your eye to find the cause of your infection. How is this treated? This condition may be treated with: Antibiotic eye drops or ointment to clear the infection more quickly and prevent the spread of infection to others. Antibiotic medicines taken by mouth (orally) to treat infections that do not respond to drops or ointments or that last longer than 10 days. Cool, wet cloths (cool  compresses) placed on the eyes. Artificial tears applied 2-6 times a day. Follow these instructions at home: Medicines Take or apply your antibiotic medicine as told by your health care provider. Do not stop using the antibiotic, even if your condition improves, unless directed by your health care provider. Take or apply over-the-counter and prescription medicines only as told by your health care provider. Be very careful to avoid touching the edge of your eyelid with the eye-drop bottle or the ointment tube when you apply medicines to the affected eye. This will keep you from spreading the infection to your other eye or to other people. Managing discomfort Gently wipe away any drainage from your eye with a warm, wet washcloth or a cotton ball. Apply a clean, cool compress to your eye for 10-20 minutes, 3-4 times a day. General instructions Do not wear contact lenses until the inflammation is gone and your health care provider says it is safe to wear them again. Ask your health care provider how to sterilize or replace your contact lenses before you use them again. Wear glasses until you can resume wearing contact lenses. Avoid wearing eye makeup until the inflammation is gone. Throw away any old eye cosmetics that may be contaminated. Change or wash your pillowcase every day. Do not share towels or washcloths. This may spread the infection. Wash your hands often with soap and water for at least 20 seconds and especially before touching your face or eyes. Use paper towels to dry your hands. Avoid touching or rubbing your eyes. Do not drive or use heavy machinery if your vision is blurred. Contact  a health care provider if: You have a fever. Your symptoms do not get better after 10 days. Get help right away if: You have a fever and your symptoms suddenly get worse. You have severe pain when you move your eye. You have facial pain, redness, or swelling. You have a sudden loss of  vision. Summary Bacterial conjunctivitis is an infection of the clear membrane that covers the white part of the eye and the inner surface of the eyelid (conjunctiva). Bacterial conjunctivitis spreads easily from eye to eye and from person to person (is contagious). Wash your hands often with soap and water for at least 20 seconds and especially before touching your face or eyes. Use paper towels to dry your hands. Take or apply your antibiotic medicine as told by your health care provider. Do not stop using the antibiotic even if your condition improves. Contact a health care provider if you have a fever or if your symptoms do not get better after 10 days. Get help right away if you have a sudden loss of vision. This information is not intended to replace advice given to you by your health care provider. Make sure you discuss any questions you have with your health care provider. Document Revised: 08/25/2020 Document Reviewed: 08/25/2020 Elsevier Patient Education  2024 [employer].

## 2024-02-27 ENCOUNTER — Ambulatory Visit: Payer: BLUE CROSS/BLUE SHIELD | Admitting: *Deleted

## 2024-02-27 VITALS — Ht 74.0 in | Wt 230.0 lb

## 2024-02-27 DIAGNOSIS — Z0000 Encounter for general adult medical examination without abnormal findings: Secondary | ICD-10-CM | POA: Diagnosis not present

## 2024-02-27 NOTE — Patient Instructions (Signed)
 [name] , Thank you for taking time to come for your Medicare Wellness Visit. I appreciate your ongoing commitment to your health goals. Please review the following plan we discussed and let me know if I can assist you in the future.   Screening recommendations/referrals: Colonoscopy: up to date Recommended yearly ophthalmology/optometry visit for glaucoma screening and checkup Recommended yearly dental visit for hygiene and checkup  Vaccinations: Influenza vaccine: Education provided Pneumococcal vaccine: up to date Tdap vaccine: up to date Shingles vaccine: up to date     Preventive Care 65 Years and Older, Male Preventive care refers to lifestyle choices and visits with your health care provider that can promote health and wellness. What does preventive care include? A yearly physical exam. This is also called an annual well check. Dental exams once or twice a year. Routine eye exams. Ask your health care provider how often you should have your eyes checked. Personal lifestyle choices, including: Daily care of your teeth and gums. Regular physical activity. Eating a healthy diet. Avoiding tobacco and drug use. Limiting alcohol use. Practicing safe sex. Taking low doses of aspirin every day. Taking vitamin and mineral supplements as recommended by your health care provider. What happens during an annual well check? The services and screenings done by your health care provider during your annual well check will depend on your age, overall health, lifestyle risk factors, and family history of disease. Counseling  Your health care provider may ask you questions about your: Alcohol use. Tobacco use. Drug use. Emotional well-being. Home and relationship well-being. Sexual activity. Eating habits. History of falls. Memory and ability to understand (cognition). Work and work Astronomer. Screening  You may have the following tests or measurements: Height, weight, and  BMI. Blood pressure. Lipid and cholesterol levels. These may be checked every 5 years, or more frequently if you are over 1 years old. Skin check. Lung cancer screening. You may have this screening every year starting at age 43 if you have a 30-pack-year history of smoking and currently smoke or have quit within the past 15 years. Fecal occult blood test (FOBT) of the stool. You may have this test every year starting at age 24. Flexible sigmoidoscopy or colonoscopy. You may have a sigmoidoscopy every 5 years or a colonoscopy every 10 years starting at age 31. Prostate cancer screening. Recommendations will vary depending on your family history and other risks. Hepatitis C blood test. Hepatitis B blood test. Sexually transmitted disease (STD) testing. Diabetes screening. This is done by checking your blood sugar (glucose) after you have not eaten for a while (fasting). You may have this done every 1-3 years. Abdominal aortic aneurysm (AAA) screening. You may need this if you are a current or former smoker. Osteoporosis. You may be screened starting at age 50 if you are at high risk. Talk with your health care provider about your test results, treatment options, and if necessary, the need for more tests. Vaccines  Your health care provider may recommend certain vaccines, such as: Influenza vaccine. This is recommended every year. Tetanus, diphtheria, and acellular pertussis (Tdap, Td) vaccine. You may need a Td booster every 10 years. Zoster vaccine. You may need this after age 61. Pneumococcal 13-valent conjugate (PCV13) vaccine. One dose is recommended after age 9. Pneumococcal polysaccharide (PPSV23) vaccine. One dose is recommended after age 75. Talk to your health care provider about which screenings and vaccines you need and how often you need them. This information is not intended to replace  advice given to you by your health care provider. Make sure you discuss any questions you have  with your health care provider. Document Released: 06/11/2015 Document Revised: 02/02/2016 Document Reviewed: 03/16/2015 Elsevier Interactive Patient Education  2017 [employer].  Fall Prevention in the Home Falls can cause injuries. They can happen to people of all ages. There are many things you can do to make your home safe and to help prevent falls. What can I do on the outside of my home? Regularly fix the edges of walkways and driveways and fix any cracks. Remove anything that might make you trip as you walk through a door, such as a raised step or threshold. Trim any bushes or trees on the path to your home. Use bright outdoor lighting. Clear any walking paths of anything that might make someone trip, such as rocks or tools. Regularly check to see if handrails are loose or broken. Make sure that both sides of any steps have handrails. Any raised decks and porches should have guardrails on the edges. Have any leaves, snow, or ice cleared regularly. Use sand or salt on walking paths during winter. Clean up any spills in your garage right away. This includes oil or grease spills. What can I do in the bathroom? Use night lights. Install grab bars by the toilet and in the tub and shower. Do not use towel bars as grab bars. Use non-skid mats or decals in the tub or shower. If you need to sit down in the shower, use a plastic, non-slip stool. Keep the floor dry. Clean up any water that spills on the floor as soon as it happens. Remove soap buildup in the tub or shower regularly. Attach bath mats securely with double-sided non-slip rug tape. Do not have throw rugs and other things on the floor that can make you trip. What can I do in the bedroom? Use night lights. Make sure that you have a light by your bed that is easy to reach. Do not use any sheets or blankets that are too big for your bed. They should not hang down onto the floor. Have a firm chair that has side arms. You can use  this for support while you get dressed. Do not have throw rugs and other things on the floor that can make you trip. What can I do in the kitchen? Clean up any spills right away. Avoid walking on wet floors. Keep items that you use a lot in easy-to-reach places. If you need to reach something above you, use a strong step stool that has a grab bar. Keep electrical cords out of the way. Do not use floor polish or wax that makes floors slippery. If you must use wax, use non-skid floor wax. Do not have throw rugs and other things on the floor that can make you trip. What can I do with my stairs? Do not leave any items on the stairs. Make sure that there are handrails on both sides of the stairs and use them. Fix handrails that are broken or loose. Make sure that handrails are as long as the stairways. Check any carpeting to make sure that it is firmly attached to the stairs. Fix any carpet that is loose or worn. Avoid having throw rugs at the top or bottom of the stairs. If you do have throw rugs, attach them to the floor with carpet tape. Make sure that you have a light switch at the top of the stairs and the bottom  of the stairs. If you do not have them, ask someone to add them for you. What else can I do to help prevent falls? Wear shoes that: Do not have high heels. Have rubber bottoms. Are comfortable and fit you well. Are closed at the toe. Do not wear sandals. If you use a stepladder: Make sure that it is fully opened. Do not climb a closed stepladder. Make sure that both sides of the stepladder are locked into place. Ask someone to hold it for you, if possible. Clearly mark and make sure that you can see: Any grab bars or handrails. First and last steps. Where the edge of each step is. Use tools that help you move around (mobility aids) if they are needed. These include: Canes. Walkers. Scooters. Crutches. Turn on the lights when you go into a dark area. Replace any light bulbs  as soon as they burn out. Set up your furniture so you have a clear path. Avoid moving your furniture around. If any of your floors are uneven, fix them. If there are any pets around you, be aware of where they are. Review your medicines with your doctor. Some medicines can make you feel dizzy. This can increase your chance of falling. Ask your doctor what other things that you can do to help prevent falls. This information is not intended to replace advice given to you by your health care provider. Make sure you discuss any questions you have with your health care provider. Document Released: 03/11/2009 Document Revised: 10/21/2015 Document Reviewed: 06/19/2014 Elsevier Interactive Patient Education  2017 [employer].
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
## 2024-06-16 ENCOUNTER — Other Ambulatory Visit: Payer: Self-pay

## 2024-06-16 ENCOUNTER — Ambulatory Visit: Admitting: Family Medicine

## 2024-06-16 MED ORDER — AMLODIPINE BESYLATE 10 MG PO TABS: ORAL_TABLET | ORAL | 0 refills | Status: DC

## 2024-06-16 MED ORDER — FUROSEMIDE 20 MG PO TABS: 20.0000 mg | ORAL_TABLET | Freq: Every day | ORAL | 0 refills | Status: DC

## 2024-06-16 MED ORDER — LISINOPRIL 10 MG PO TABS: 10.0000 mg | ORAL_TABLET | Freq: Every day | ORAL | 0 refills | Status: DC

## 2024-06-19 ENCOUNTER — Encounter: Payer: Self-pay | Admitting: Family Medicine

## 2024-06-19 ENCOUNTER — Ambulatory Visit: Admitting: Family Medicine

## 2024-06-19 VITALS — BP 127/75 | HR 72 | Temp 98.1°F | Wt 232.0 lb

## 2024-06-19 DIAGNOSIS — I10 Essential (primary) hypertension: Secondary | ICD-10-CM | POA: Diagnosis not present

## 2024-06-19 DIAGNOSIS — Z125 Encounter for screening for malignant neoplasm of prostate: Secondary | ICD-10-CM

## 2024-06-19 DIAGNOSIS — G720 Drug-induced myopathy: Secondary | ICD-10-CM

## 2024-06-19 DIAGNOSIS — E875 Hyperkalemia: Secondary | ICD-10-CM | POA: Diagnosis not present

## 2024-06-19 DIAGNOSIS — E782 Mixed hyperlipidemia: Secondary | ICD-10-CM

## 2024-06-19 DIAGNOSIS — R7303 Prediabetes: Secondary | ICD-10-CM

## 2024-06-19 LAB — LIPID PANEL
Cholesterol: 171 mg/dL (ref 28–200)
HDL: 53.4 mg/dL
LDL Cholesterol: 100 mg/dL — ABNORMAL HIGH (ref 10–99)
NonHDL: 117.41
Total CHOL/HDL Ratio: 3
Triglycerides: 85 mg/dL (ref 10.0–149.0)
VLDL: 17 mg/dL (ref 0.0–40.0)

## 2024-06-19 LAB — HEMOGLOBIN A1C: Hgb A1c MFr Bld: 5.9 % (ref 4.6–6.5)

## 2024-06-19 LAB — COMPREHENSIVE METABOLIC PANEL WITH GFR
ALT: 17 U/L (ref 3–53)
AST: 18 U/L (ref 5–37)
Albumin: 4.5 g/dL (ref 3.5–5.2)
Alkaline Phosphatase: 42 U/L (ref 39–117)
BUN: 15 mg/dL (ref 6–23)
CO2: 27 meq/L (ref 19–32)
Calcium: 9.8 mg/dL (ref 8.4–10.5)
Chloride: 99 meq/L (ref 96–112)
Creatinine, Ser: 1.34 mg/dL (ref 0.40–1.50)
GFR: 54.76 mL/min — ABNORMAL LOW
Glucose, Bld: 106 mg/dL — ABNORMAL HIGH (ref 70–99)
Potassium: 4.6 meq/L (ref 3.5–5.1)
Sodium: 133 meq/L — ABNORMAL LOW (ref 135–145)
Total Bilirubin: 0.5 mg/dL (ref 0.2–1.2)
Total Protein: 6.8 g/dL (ref 6.0–8.3)

## 2024-06-19 LAB — CBC
HCT: 41.4 % (ref 39.0–52.0)
Hemoglobin: 13.9 g/dL (ref 13.0–17.0)
MCHC: 33.5 g/dL (ref 30.0–36.0)
MCV: 88.1 fl (ref 78.0–100.0)
Platelets: 208 K/uL (ref 150.0–400.0)
RBC: 4.7 Mil/uL (ref 4.22–5.81)
RDW: 13.5 % (ref 11.5–15.5)
WBC: 4.3 K/uL (ref 4.0–10.5)

## 2024-06-19 LAB — PSA, MEDICARE: PSA: 2.87 ng/mL (ref 0.10–4.00)

## 2024-06-19 LAB — TSH: TSH: 2.81 u[IU]/mL (ref 0.35–5.50)

## 2024-06-19 MED ORDER — LISINOPRIL 10 MG PO TABS: 15.0000 mg | ORAL_TABLET | Freq: Every day | ORAL | 1 refills | Status: AC

## 2024-06-19 MED ORDER — FUROSEMIDE 20 MG PO TABS: 20.0000 mg | ORAL_TABLET | Freq: Every day | ORAL | 1 refills | Status: AC

## 2024-06-19 MED ORDER — AMLODIPINE BESYLATE 10 MG PO TABS: ORAL_TABLET | ORAL | 1 refills | Status: AC
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
## 2024-06-19 NOTE — Patient Instructions (Signed)
 Return in about 24 weeks (around 12/04/2024) for Routine chronic condition follow-up.        Great to see you today.  I have refilled the medication(s) we provide.   If labs were collected or images ordered, we will inform you of  results once we have received them and reviewed. We will contact you either by echart message, or telephone call.  Please give ample time to the testing facility, and our office to run,  receive and review results. Please do not call inquiring of results, even if you can see them in your chart. We will contact you as soon as we are able. If it has been over 1 week since the test was completed, and you have not yet heard from us , then please call us .    - echart message- for normal results that have been seen by the patient already.   - telephone call: abnormal results or if patient has not viewed results in their echart.  If a referral to a specialist was entered for you, please call us  in 2 weeks if you have not heard from the specialist office to schedule.

## 2024-06-20 ENCOUNTER — Ambulatory Visit: Payer: Self-pay | Admitting: Family Medicine

## 2024-11-13 ENCOUNTER — Ambulatory Visit: Admitting: Allergy

## 2024-12-04 ENCOUNTER — Ambulatory Visit: Admitting: Family Medicine

## 2025-03-04 ENCOUNTER — Ambulatory Visit
